# Patient Record
Sex: Male | Born: 1950
Health system: Southern US, Community
[De-identification: ages and names within clinical notes are randomized; demographics above are authoritative.]

## PROBLEM LIST (undated history)

## (undated) DIAGNOSIS — E7800 Pure hypercholesterolemia, unspecified: Secondary | ICD-10-CM

## (undated) DIAGNOSIS — N289 Disorder of kidney and ureter, unspecified: Secondary | ICD-10-CM

## (undated) DIAGNOSIS — K449 Diaphragmatic hernia without obstruction or gangrene: Secondary | ICD-10-CM

## (undated) DIAGNOSIS — I10 Essential (primary) hypertension: Secondary | ICD-10-CM

## (undated) DIAGNOSIS — E669 Obesity, unspecified: Secondary | ICD-10-CM

## (undated) DIAGNOSIS — E119 Type 2 diabetes mellitus without complications: Secondary | ICD-10-CM

## (undated) HISTORY — DX: Obesity, unspecified: E66.9

## (undated) HISTORY — DX: Pure hypercholesterolemia, unspecified: E78.00

## (undated) HISTORY — DX: Essential (primary) hypertension: I10

## (undated) HISTORY — DX: Diaphragmatic hernia without obstruction or gangrene: K44.9

## (undated) HISTORY — DX: Disorder of kidney and ureter, unspecified: N28.9

## (undated) HISTORY — DX: Type 2 diabetes mellitus without complications: E11.9

---
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
## 1997-10-10 ENCOUNTER — Other Ambulatory Visit: Admission: RE | Admit: 1997-10-10 | Discharge: 1997-10-10 | Payer: Self-pay | Admitting: Cardiology

## 1998-10-18 ENCOUNTER — Emergency Department (HOSPITAL_COMMUNITY): Admission: EM | Admit: 1998-10-18 | Discharge: 1998-10-18 | Payer: Self-pay | Admitting: Emergency Medicine

## 1999-10-01 ENCOUNTER — Encounter (INDEPENDENT_AMBULATORY_CARE_PROVIDER_SITE_OTHER): Payer: Self-pay | Admitting: Specialist

## 1999-10-01 ENCOUNTER — Ambulatory Visit (HOSPITAL_COMMUNITY): Admission: RE | Admit: 1999-10-01 | Discharge: 1999-10-01 | Payer: Self-pay | Admitting: Gastroenterology

## 2000-09-22 ENCOUNTER — Ambulatory Visit (HOSPITAL_COMMUNITY): Admission: RE | Admit: 2000-09-22 | Discharge: 2000-09-22 | Payer: Self-pay | Admitting: Gastroenterology

## 2000-09-22 ENCOUNTER — Encounter (INDEPENDENT_AMBULATORY_CARE_PROVIDER_SITE_OTHER): Payer: Self-pay | Admitting: Specialist

## 2001-08-30 ENCOUNTER — Encounter: Payer: Self-pay | Admitting: Emergency Medicine

## 2001-08-30 ENCOUNTER — Emergency Department (HOSPITAL_COMMUNITY): Admission: EM | Admit: 2001-08-30 | Discharge: 2001-08-30 | Payer: Self-pay | Admitting: Emergency Medicine

## 2001-09-10 ENCOUNTER — Encounter: Payer: Self-pay | Admitting: General Surgery

## 2001-09-11 ENCOUNTER — Encounter: Payer: Self-pay | Admitting: General Surgery

## 2001-09-11 ENCOUNTER — Observation Stay (HOSPITAL_COMMUNITY): Admission: RE | Admit: 2001-09-11 | Discharge: 2001-09-12 | Payer: Self-pay | Admitting: General Surgery

## 2001-09-11 ENCOUNTER — Encounter (INDEPENDENT_AMBULATORY_CARE_PROVIDER_SITE_OTHER): Payer: Self-pay

## 2002-12-20 ENCOUNTER — Inpatient Hospital Stay (HOSPITAL_COMMUNITY): Admission: EM | Admit: 2002-12-20 | Discharge: 2002-12-22 | Payer: Self-pay | Admitting: Urology

## 2005-10-18 HISTORY — PX: CARDIOVASCULAR STRESS TEST: SHX262

## 2010-04-05 ENCOUNTER — Encounter: Payer: Self-pay | Admitting: Cardiovascular Disease

## 2010-04-05 ENCOUNTER — Ambulatory Visit: Payer: Self-pay | Admitting: Cardiology

## 2010-06-12 ENCOUNTER — Encounter: Payer: Self-pay | Admitting: Family Medicine

## 2010-07-16 ENCOUNTER — Other Ambulatory Visit: Payer: Self-pay | Admitting: Dermatology

## 2010-07-30 ENCOUNTER — Ambulatory Visit (INDEPENDENT_AMBULATORY_CARE_PROVIDER_SITE_OTHER): Payer: PRIVATE HEALTH INSURANCE | Admitting: Cardiology

## 2010-07-30 ENCOUNTER — Other Ambulatory Visit: Payer: Self-pay | Admitting: Cardiology

## 2010-07-30 DIAGNOSIS — E7800 Pure hypercholesterolemia, unspecified: Secondary | ICD-10-CM

## 2010-07-30 DIAGNOSIS — E119 Type 2 diabetes mellitus without complications: Secondary | ICD-10-CM

## 2010-07-30 LAB — COMPREHENSIVE METABOLIC PANEL
ALT: 21 U/L (ref 0–53)
AST: 21 U/L (ref 0–37)
Albumin: 5.1 g/dL (ref 3.5–5.2)
Alkaline Phosphatase: 61 U/L (ref 39–117)
BUN: 21 mg/dL (ref 6–23)
CO2: 25 mEq/L (ref 19–32)
Calcium: 9.9 mg/dL (ref 8.4–10.5)
Chloride: 105 mEq/L (ref 96–112)
Creat: 1.11 mg/dL (ref 0.40–1.50)
Glucose, Bld: 101 mg/dL — ABNORMAL HIGH (ref 70–99)
Potassium: 4.5 mEq/L (ref 3.5–5.3)
Sodium: 141 mEq/L (ref 135–145)
Total Bilirubin: 0.8 mg/dL (ref 0.3–1.2)
Total Protein: 7.3 g/dL (ref 6.0–8.3)

## 2010-07-30 LAB — LIPID PANEL
Cholesterol: 128 mg/dL (ref 0–200)
HDL: 42 mg/dL (ref >39)
LDL Cholesterol: 66 mg/dL (ref 0–99)
Total CHOL/HDL Ratio: 3 Ratio
Triglycerides: 102 mg/dL (ref <150)
VLDL: 20 mg/dL (ref 0–40)

## 2010-08-06 ENCOUNTER — Ambulatory Visit: Payer: Self-pay | Admitting: Cardiology

## 2010-10-08 NOTE — Op Note (Signed)
[facility]  Patient:    JOANDRY, SLAGTER Visit Number: [id number] MRN: [id number]          Service Type: DSU Location: DAY Attending Physician:  Caleen Essex Dictated by:   Ollen Gross. Vernell Morgans, M.D. Proc. Date: 09/11/01 Admit Date:  09/11/2001                             Operative Report  PREOPERATIVE DIAGNOSIS:  Cholelithiasis.  POSTOPERATIVE DIAGNOSIS:  Cholelithiasis.  PROCEDURE:  Laparoscopic cholecystectomy with negative intraoperative cholangiogram.  SURGEON:  Ollen Gross. Vernell Morgans, M.D.  ASSISTANT:  Anselm Pancoast. Zachery Dakins, M.D.  ANESTHESIA:  General endotracheal.  DESCRIPTION OF PROCEDURE:  After informed consent was obtained, the patient was brought to the operating room and placed in a supine position on the operating table.  After adequate induction of general anesthesia, the patients abdomen was prepped with Betadine and draped in the usual sterile manner.  The area above the umbilicus was infiltrated with 0.25% Marcaine and a small transverse incision was made with the 15 blade knife.  This incision was carried down through to the subcutaneous tissue using blunt dissection with a Kelly clamp and [employer] retractors until the linea alba was identified.  The linea labia was incised with a 15 blade knife and each side was grasped with Kocher clamps and elevated anteriorly.  The preperitoneal space was then probed bluntly with the hemostat until the peritoneum was opened, and access was gained to the abdominal cavity.  A finger was inserted through this opening to palpate the anterior abdominal wall and there were no apparent adhesions.  A 0 Vicryl pursestring stitch was then placed in the fascia surrounding this opening.  The Hasson cannula was placed through this opening and anchored in place with the previously placed Vicryl pursestring stitch.  The abdomen was then insufflated with carbon dioxide without difficulty.  A laparoscope was placed  through the Hasson cannula and the abdomen was generally inspected and there were no obvious abnormalities.  In the right upper quadrant, the dome of the gallbladder and edge of the liver were readily identifiable.  The patient was placed in the head up position.  A small upper midline transverse incision was made after infiltrating this area with 0.25% Marcaine.  A 10 mm port was then placed bluntly through this incision and into the abdominal cavity under direct vision.  Places were chosen laterally on the right side of the abdomen for placement of the 5 mm ports and each of these areas was infiltrated with 0.25% Marcaine.  Small stab incisions were made and 5 mm ports were then placed bluntly through these incisions into the abdominal cavity under direct vision.  A blunt grasper was placed through the lateral most 5 mm port and used to grasp the dome of the gallbladder and elevated anteriorly and superiorly.  There was a small adhesion just lateral to the gallbladder of omentum to the liver and this made a small tear on the capsule of the liver.  This adhesion was then taken down with a hook electrocautery.  Another blunt grasper was placed through the other 5 mm port and used to retract on the body and neck of the gallbladder. A dissector was placed through the upper midline port and using the electrocautery, the peritoneum reflection in the area of the gallbladder neck was opened.  The blunt dissection was then carried out in this area until the  gallbladder neck and cystic duct junction was readily identified.  This area was dissected circumferentially until a good window was created.  A single clip was placed on the gallbladder neck side of this dissection and a small ductotomy was made.  There seemed to be sludge within the cystic duct and a 14-gauge angiocath was then placed percutaneously through the anterior abdominal wall and a Reddick catheter was placed through the angiocath.   The Reddick catheter was placed within the cystic duct and anchored in place with a clip and the catheter flushed easily.  A cholangiogram was then obtained which showed a long cystic duct and no filling defects within the common duct system and rapid emptying into the duodenum.  The anchoring clip was then removed and the Reddick catheter was removed from the patient with the angiocath.  Three clips were then placed proximally on the cystic duct and this duct was divided between the two sets of clips.  Blunt dissection was then carried out posterior to this where the cystic artery was identified. This was dissected in a circumferential manner until a good window was created and two clips were placed proximally and one distally on the artery, and the artery was divided between the two.  Next, a laparoscopic hook cautery device was used to separate the gallbladder from the liver bed.  Prior to completely detaching the gallbladder from the liver bed, the liver bed was inspected, and small bleeding points were coagulated with the spatula electrocautery.  The gallbladder was then detached the rest of the way from the liver bed with the spatula.  The laparoscope was then moved to the upper midline port and a gallbladder grasper was placed through the Hasson cannula and grasped the neck of the gallbladder.  The gallbladder was then removed with the Hasson cannula through the supraumbilical port without difficulty.  The fascial defect at the supraumbilical port was then closed with the previously placed Vicryl pursestring stitch.  The abdomen was then inspected and the liver bed appeared to be hemostatic.  The abdomen was irrigated with copious amounts of saline until the effluent was clear.  The rest of the ports were then removed all under direct vision and were found to be hemostatic.  The gas was allowed to escape and the skin incisions were closed with interrupted 4-0 Monocryl subcuticular  stitches.  Benzoin and Steri-Strips were applied.  The patient tolerated the procedure well.  At the end of the case, all needle,  sponge, and instrument counts were correct.  The patient was awakened and taken to the recovery room in stable condition. Dictated by:   Ollen Gross. Vernell Morgans, M.D. Attending Physician:  Caleen Essex DD:  09/11/01 TD:  09/11/01 Job: [id number]

## 2010-10-08 NOTE — H&P (Signed)
Joseph Nguyen, Joseph Nguyen                           ACCOUNT NO.:  [id number]   MEDICAL RECORD NO.:  [id number]                   PATIENT TYPE:  INP   LOCATION:  0357                                 FACILITY:  [facility]   PHYSICIAN:  Claudette Laws, M.D.               DATE OF BIRTH:  December 14, 1950   DATE OF ADMISSION:  12/20/2002  DATE OF DISCHARGE:                                HISTORY & PHYSICAL   CHIEF COMPLAINT:  Flank pain.   PRESENT ILLNESS:  This 60 year old man with known adult polycystic kidney  disease comes in now with about a 3-4 day history of rather severe flank  pain and some nausea.  Initially he presented at his work place in [place]  with severe flank pain.  He was worked up there.  They called me about him,  and I saw him in the office on December 17, 2002.  The next day he had a CT scan  in our office, which presently is at [facility] for an over-read, but it appears it  may have led into a cyst.  This patient's history goes back to at least 1986  when we first saw him and diagnosed him with the disease, as well as  hematuria.  Since then, he was seen by Dr. Darrick Penna from nephrology, but no  recommendations were made.  He had been in our office on and off over the  years, and was last seen by Dr. Wanda Plump on November 11, 1998 with some blood  in the urine, but since then over the past four years has been essentially  asymptomatic.  However, this past week he has had intermittent pain.  He  tried to go to work today, but got real diaphoretic and was transferred back  to [place].  I saw him in office, and his pulse was only 45, although his  blood pressure was 156/85.  His temperature was 97.7.  I thought at this  point the best thing to do would be admit him, check his blood count,  possibly repeat the CT scan.  I did discuss the situation with the radiology  interventionalist, but really it was not felt that there was any definitive  interventions that could be possible, although we  discussed such things as  cyst aspiration and sclerosis, but basically the treatment over the years  has been to treat him symptomatically and treat any intervening infections.  He has been on Cipro, although our urine culture showed no growth.   PAST MEDICAL HISTORY:  The patient does have a history of hypertension.   MEDICATIONS:  1. Hyzaar.  2. Lipitor.  3. Aspirin.   ALLERGIES:  No known drug allergies, although MORPHINE made him sick in  [place], [place].   PAST SURGICAL HISTORY:  He had his gallbladder removed in the year 2000.  Otherwise no surgery.   REVIEW OF SYSTEMS:  Essentially negative  review of systems.   The patient presented into the office with his wife and daughter.   FAMILY HISTORY:  Father died at age 30 from a heart attack and a stroke.  Mother is alive at 23.  He has four brothers and one sister.  He himself has  three children with his wife.   SOCIAL HISTORY:  He works as a IT trainer.  He is a social drinker.   When we first saw him, his urine was fairly clear with only a rare RBC.  His  wife states that he has not urinated today.   PHYSICAL EXAMINATION:  GENERAL:  He appears in rather moderate distress in a  wheelchair, again complaining of right flank pain.  VITAL SIGNS:  In our office, his blood pressure was 156/85, pulse 45,  temperature 97.7.  HEENT:  Unremarkable.  CHEST:  Clear to auscultation and percussion.  HEART:  Slow rhythm.  No obvious murmurs.  ABDOMEN:  Somewhat obese.  Again, CVA tenderness as in the past.  No obvious  hernias.  GENITALIA:  Circumcised male.  No lesions.  Normal testicles.  RECTUM:  Flat, smooth, nontender.  No masses, no nodules.  EXTREMITIES:  Normal.  Somewhat diaphoretic.   IMPRESSION:  1. Adult polycystic kidney disease with possible hemorrhage into a renal     cyst.  2. Rule out hydronephrosis secondary to ureteral clot colic.  3. History of hypertension.   PLAN:  I discussed the situation with him and  his wife, and I thought I  would bring him into the hospital and stabilize him with a PCA pump,  probably Dilaudid, since morphine made him sick when he was out of town.  We  will obtain a stat CBC and a BMET, and start an IV.  Will check his residual  urine, measure his output, and then possibly obtain another CT scan  tomorrow.  I will continue his IV Cipro for now and monitor his bradycardia.                                               Claudette Laws, M.D.    RFS/MEDQ  D:  12/20/2002  T:  12/20/2002  Job:  [id number]

## 2010-10-08 NOTE — Procedures (Signed)
[place]  Patient:    LADARIAN, BONCZEK                        MRN: [id number] Proc. Date: 10/01/99 Adm. Date:  [id number] Disc. Date: [id number] Attending:  Louie Bun CC:         Chales Salmon. Abigail Miyamoto, M.D.                           Procedure Report  PROCEDURE PERFORMED:  Colonoscopy.  ENDOSCOPIST:  Everardo All. Madilyn Fireman, M.D.  INDICATIONS FOR PROCEDURE:  Iron deficiency anemia in a 60 year old patient with no prior colon screening.  DESCRIPTION OF PROCEDURE:  The patient was placed in the left lateral decubitus position and placed on the pulse monitor and continuous low flow oxygen delivered by nasal cannula.  She was sedated with 100 mg IV Demerol and 10 mg IV Versed.  The Olympus video colonoscope was inserted into the rectum and advanced to the cecum, confirmed by transillumination of McBurneys point and visualization of the ileocecal valve and the appendiceal orifice.  The prep was excellent.  The cecum, ascending, transverse, descending and sigmoid colon all appeared normal with no masses, polyps, diverticula or other mucosal abnormalities.  The rectum likewise appeared normal and a retroflex view of the anus revealed some small internal hemorrhoids.  The colonoscope was then withdrawn and the patient returned to the recovery room in stable condition having tolerated the procedure well.   There were no immediate complications.  IMPRESSION:  Internal hemorrhoids.  Otherwise normal colonoscopy.  PLAN:  Further work-up of anemia as appropriate. DD:  10/01/99 TD:  10/04/99 Job: [id number]

## 2010-10-08 NOTE — Procedures (Signed)
[facility]  Patient:    Joseph Nguyen                          MRN: [id number] Proc. Date: 09/22/00 Attending:  Everardo All. Madilyn Fireman, M.D.                           Procedure Report  PROCEDURE:  Colonoscopy with polypectomy.  INDICATION FOR PROCEDURE:  Large sessile ascending colon polyps on previous procedure.  The most proximal one had a lipomatous appearance, but biopsies showed adenoma.  The second one was snared, hopefully to destruction, and a third ascending colon polyp was cold biopsied but not removed at the time.  DESCRIPTION OF PROCEDURE:  The patient was placed in the left lateral decubitus position and placed on the pulse monitor with continuous low-flow oxygen delivered by nasal cannula.  He was sedated with 70 mg IV Demerol and 7 mg IV Versed.  The Olympus video colonoscope was inserted into the rectum and advanced to the cecum, confirmed by transillumination at McBurneys point and visualization of the ileocecal valve and appendiceal orifice.  The prep was good.  The cecum appeared normal.  Just beyond the ileocecal valve was seen the previously seen yellowish, lipomatous-looking lesion that had shown adenomatous elements when biopsied.  This was snared and seemed to be removed essentially in one piece, but I touched up the base with the hot biopsy forceps, and this was sent in the first specimen container.  A second larger sessile polyp approximately 1.5-2 cm in diameter, corresponding to the lesion that had been biopsied but not fulgurated was seen in the mid ascending colon. This was removed by snare in two pieces.  It was not clear whether the entire lesion had been completely destroyed.  The remainder of the descending, transverse, and descending colon appeared normal with no further polyps. Within the sigmoid colon there was seen a few diverticula, and within the sigmoid and rectum, there were seen several small scattered,  somewhat translucent sessile polyps less than 8 mm in diameter.  These appeared to be consistent with hyperplastic polyps, and I did not address any of these. Otherwise the rectum appeared normal.  The colonoscope was then withdrawn, and the patient returned to the recovery room in stable condition.  He tolerated the procedure well, and there were no immediate complications.  IMPRESSION: 1. Ascending colon polyp with possible lipomatous elements. 2. Second larger ascending colon polyps removed by snare. 3. Diverticulosis. 4. Diminutive rectal and rectosigmoid polyps, probably hyperplastic.  PLAN:  Await biopsy results and based on uncertainty of removal of the larger of the two polyps, we will probably repeat colonoscopy in one year. DD:  09/22/00 TD:  09/22/00 Job: [id number]

## 2010-10-08 NOTE — Procedures (Signed)
[place]. [facility]  Patient:    Joseph Nguyen, Joseph Nguyen                        MRN: [id number] Proc. Date: 10/01/99 Adm. Date:  [id number] Disc. Date: [id number] Attending:  Louie Bun Dictator:   Everardo All Madilyn Fireman, M.D.                           Procedure Report  PROCEDURE:  Colonoscopy with polypectomy and biopsy.  INDICATIONS:  Intermittent rectal bleeding of six months duration in a 60 year old patient.  DESCRIPTION OF PROCEDURE:  The patient was placed in the left lateral decubitus position and placed on the pulse monitor with continuous low flow oxygen delivered by nasal cannula.  He was sedated with 50 mg of IV Demerol and 5 mg of IV Versed.  The Olympus video colonoscope was inserted into the rectum and advanced to the cecum, confirmed by transillumination of McBurneys point and visualization of the ileocecal valve and appendiceal orifice.  The prep was fairly good.  The base of the cecum appeared normal.  At the distal end of the cecum there was a vague oblong shaped elevation of mucosa along a ______ ridge with slight granularity and some yellow color suggesting a fatty component.  Overall, it could not be clearly ascertained whether this was an adenomatous polyp, a lipoma, or other histology.  I attempted to hot biopsy in places, but this was incomplete and some viable tissue was left behind.  In the ascending colon just distal to this there was a fold of elevated mucosa that did not have a fatty appearance to it, and the surface appeared somewhat velvety, consistent with an adenoma, but it was very poorly delineated from the surrounding mucosa.  I carefully tried to then snared what appeared to be the margins of this lesion and did remove this section of mucosa in one piece, and this was sent in a separate specimen container.  A third lesion very similar to the second one was seen 5 cm distal, and I elected not to snare this lesion, given its vague  margins and uncertain about whether it represented true neoplasm.  I elected with this lesion to take cold biopsies from the most granular areas of the surface, and these were sent in a third specimen container.  The remainder of the ascending, transverse, descending, and sigmoid colon appeared normal with no further polyps, masses, diverticula, or other mucosal abnormalities.  The rectum appeared normal, but retroflex view of the anus did reveal some small internal hemorrhoids.  The colonoscope was then withdrawn, and the patient returned to the recovery room in stable condition.  He tolerated the procedure well, and there were no immediate complications.  IMPRESSION: 1. One cecal oblong lesion, unclear of whether representing polyp or lipoma. 2. Two ascending colon lesions suspicious for adenoma, but very poorly delineated from the surrounding mucosa.  One lesion was snared, the other was biopsied. 3. Internal hemorrhoids.  PLAN:  Await histology for determination of need and interval for repeat colonoscope for further fulguration. DD:  10/01/99 TD:  10/03/99 Job: [id number]

## 2011-03-11 ENCOUNTER — Other Ambulatory Visit: Payer: Self-pay | Admitting: Gastroenterology

## 2011-03-23 ENCOUNTER — Other Ambulatory Visit: Payer: Self-pay | Admitting: Dermatology

## 2011-06-16 ENCOUNTER — Other Ambulatory Visit: Payer: Self-pay | Admitting: Cardiology

## 2011-06-17 ENCOUNTER — Other Ambulatory Visit: Payer: Self-pay | Admitting: *Deleted

## 2011-06-17 ENCOUNTER — Other Ambulatory Visit: Payer: Self-pay | Admitting: Cardiovascular Disease

## 2011-06-17 NOTE — Telephone Encounter (Signed)
Opened in Error.

## 2011-06-17 NOTE — Telephone Encounter (Signed)
Refill F/U    Patient wife Lynden Ang calling for f/u on patient crestor Refill.  She can be reached at [phone]

## 2011-06-17 NOTE — Telephone Encounter (Signed)
Fax Received. Refill Completed. Joseph Nguyen (R.M.A)   

## 2011-07-21 ENCOUNTER — Encounter: Payer: Self-pay | Admitting: *Deleted

## 2011-07-27 ENCOUNTER — Ambulatory Visit (INDEPENDENT_AMBULATORY_CARE_PROVIDER_SITE_OTHER): Payer: PRIVATE HEALTH INSURANCE | Admitting: Cardiovascular Disease

## 2011-07-27 ENCOUNTER — Encounter: Payer: Self-pay | Admitting: Cardiovascular Disease

## 2011-07-27 ENCOUNTER — Encounter: Payer: Self-pay | Admitting: *Deleted

## 2011-07-27 DIAGNOSIS — I10 Essential (primary) hypertension: Secondary | ICD-10-CM

## 2011-07-27 DIAGNOSIS — E785 Hyperlipidemia, unspecified: Secondary | ICD-10-CM

## 2011-07-27 HISTORY — DX: Essential (primary) hypertension: I10

## 2011-07-27 NOTE — Progress Notes (Signed)
    Trenda Moots Date of Birth  1950/09/15 [facility]     [place]  [address]    [address]   [address] [place], [place]  [address]    [place], [place]  [address] [phone]  Fax  [phone]  [phone]  Fax [phone]  Problems: 1. HTN  History of Present Illness:  Joseph Nguyen is a 61 year old gentleman who was previously seen by Dr. Deborah Chalk. He's done well. He has a history of hypertension. He denies any episodes of chest pain or shortness of breath.  Current Outpatient Prescriptions on File Prior to Visit  Medication Sig Dispense Refill  . CRESTOR 10 MG tablet take 1 tablet by mouth once daily  60 tablet  0  . losartan-hydrochlorothiazide (HYZAAR) 100-12.5 MG per tablet Take 1 tablet by mouth daily.        No Known Allergies  Past Medical History  Diagnosis Date  . Hypercholesterolemia   . Hiatal hernia   . Obesity   . HTN (hypertension)   . Kidney disease     Polycystic,   . DM (diabetes mellitus)     Past Surgical History  Procedure Date  . Tumor removal 1968    Left Breast Benign  . Cardiovascular stress test 10-18-2005    EF 79%    History  Smoking status  . Former Smoker  Smokeless tobacco  . Not on file  Comment: Quite 11yrs+ ago    History  Alcohol Use: Not on file    No family history on file.  Reviw of Systems:  Reviewed in the HPI.  All other systems are negative.  Physical Exam: Blood pressure 127/86, pulse 58, height 6' (1.829 m), weight 213 lb 12.8 oz (96.979 kg). General: Well developed, well nourished, in no acute distress.  Head: Normocephalic, atraumatic, sclera non-icteric, mucus membranes are moist,   Neck: Supple. Carotids are 2 + without bruits. No JVD  Lungs: Clear bilaterally to auscultation.  Heart: regular rate  With normal  S1 S2. No murmurs, gallops or rubs.  Abdomen: Soft, non-tender, non-distended with normal bowel sounds. No hepatomegaly. No rebound/guarding. No masses.  Msk:   Strength and tone are normal  Extremities: No clubbing or cyanosis. No edema.  Distal pedal pulses are 2+ and equal bilaterally.  Neuro: Alert and oriented X 3. Moves all extremities spontaneously.  Psych:  Responds to questions appropriately with a normal affect.  ECG: Sinus bradycardia.  No ST or T wave changes.  Assessment / Plan:

## 2011-07-27 NOTE — Patient Instructions (Signed)
Your physician wants you to follow-up in: 1 year  You will receive a reminder letter in the mail two months in advance. If you don't receive a letter, please call our office to schedule the follow-up appointment.  Your physician recommends that you return for a FASTING lipid profile: one year or bring a copy of your last cholesterol levels from your primary

## 2011-07-27 NOTE — Assessment & Plan Note (Signed)
Joseph Nguyen is doing very well. We'll continue with the same medications.  I'll see him again in one year for followup office visit.

## 2011-09-02 ENCOUNTER — Other Ambulatory Visit: Payer: Self-pay | Admitting: Cardiovascular Disease

## 2012-10-02 ENCOUNTER — Other Ambulatory Visit: Payer: Self-pay

## 2012-10-02 MED ORDER — ROSUVASTATIN CALCIUM 10 MG PO TABS: ORAL_TABLET | ORAL | Status: DC

## 2012-10-02 NOTE — Telephone Encounter (Signed)
pt wife called to rqst refiil for pt crestor. pt wife aware that pt is past due for follow up visit w/Dr.[name] pt will call back to schedule. refill completed for 30r-1 pt wife aware

## 2012-11-15 ENCOUNTER — Encounter: Payer: Self-pay | Admitting: Cardiology

## 2012-11-21 ENCOUNTER — Ambulatory Visit: Payer: PRIVATE HEALTH INSURANCE | Admitting: Cardiovascular Disease

## 2012-12-06 ENCOUNTER — Other Ambulatory Visit: Payer: Self-pay

## 2012-12-06 MED ORDER — ROSUVASTATIN CALCIUM 10 MG PO TABS: ORAL_TABLET | ORAL | Status: DC

## 2012-12-17 ENCOUNTER — Ambulatory Visit: Payer: PRIVATE HEALTH INSURANCE | Admitting: Cardiovascular Disease

## 2013-03-05 ENCOUNTER — Ambulatory Visit (INDEPENDENT_AMBULATORY_CARE_PROVIDER_SITE_OTHER): Payer: 59 | Admitting: Emergency Medicine

## 2013-03-05 VITALS — BP 122/76 | HR 62 | Temp 97.9°F | Resp 18 | Ht 70.75 in | Wt 216.0 lb

## 2013-03-05 DIAGNOSIS — Z7189 Other specified counseling: Secondary | ICD-10-CM

## 2013-03-05 DIAGNOSIS — Z23 Encounter for immunization: Secondary | ICD-10-CM

## 2013-03-05 MED ORDER — TYPHOID VACCINE PO CPDR: 1.0000 | DELAYED_RELEASE_CAPSULE | ORAL | Status: DC

## 2013-03-05 NOTE — Progress Notes (Signed)
  Subjective:    Patient ID: Joseph Nguyen, male    DOB: [birth year], 62 y.o.   MRN: [id number]  HPI Patient presents for immunizations for upcoming travel to [place], [place] and [place].  No current/recent illness.  Otherwise he has no complaints.  States polio was recommended as well, however he has already had that when in the [employer].  Past Medical History  Diagnosis Date  . Hypercholesterolemia   . Hiatal hernia   . Obesity   . HTN (hypertension)   . Kidney disease     Polycystic,   . DM (diabetes mellitus)    Past Surgical History  Procedure Laterality Date  . Tumor removal  1968    Left Breast Benign  . Cardiovascular stress test  10-18-2005    EF 79%   Allergies  Allergen Reactions  . Zocor [Simvastatin - High Dose]     myalgias      Review of Systems All systems negative.    Objective:   Physical Exam  Blood pressure 122/76, pulse 62, temperature 97.9 F (36.6 C), temperature source Oral, resp. rate 18, height 5' 10.75" (1.797 m), weight 216 lb (97.977 kg), SpO2 97.00%. Body mass index is 30.34 kg/(m^2). Well-developed, well nourishe male who is awake, alert and oriented, in NAD. HEENT: [place]/AT, PERRL, EOMI.  Sclera and conjunctiva are clear. OP is clear. Neck: supple Heart: RRR, no murmur Lungs: normal effort Abdomen: normo-active bowel sounds, supple, non-tender, no mass or organomegaly. Extremities: no cyanosis, clubbing or edema. Skin: warm and dry without rash. Psychologic: good mood and appropriate affect, normal speech and behavior.       Assessment & Plan:  Immunizations  Given Hep A and rx for typhoid

## 2013-03-08 ENCOUNTER — Encounter: Payer: PRIVATE HEALTH INSURANCE | Admitting: Internal Medicine

## 2013-08-22 ENCOUNTER — Other Ambulatory Visit: Payer: Self-pay

## 2013-08-22 MED ORDER — ROSUVASTATIN CALCIUM 10 MG PO TABS: ORAL_TABLET | ORAL | Status: DC

## 2013-11-12 ENCOUNTER — Other Ambulatory Visit: Payer: Self-pay | Admitting: Internal Medicine

## 2013-11-12 DIAGNOSIS — Z87891 Personal history of nicotine dependence: Secondary | ICD-10-CM

## 2014-01-06 ENCOUNTER — Ambulatory Visit
Admission: RE | Admit: 2014-01-06 | Discharge: 2014-01-06 | Disposition: A | Discharge status: Home / Self Care | Payer: 59 | Source: Ambulatory Visit | Attending: Internal Medicine | Admitting: Internal Medicine

## 2014-01-06 DIAGNOSIS — Z136 Encounter for screening for cardiovascular disorders: Secondary | ICD-10-CM | POA: Diagnosis not present

## 2014-01-06 DIAGNOSIS — Z87891 Personal history of nicotine dependence: Secondary | ICD-10-CM

## 2014-01-22 DIAGNOSIS — R040 Epistaxis: Secondary | ICD-10-CM | POA: Diagnosis not present

## 2014-01-22 DIAGNOSIS — J310 Chronic rhinitis: Secondary | ICD-10-CM | POA: Diagnosis not present

## 2014-03-21 DIAGNOSIS — Z23 Encounter for immunization: Secondary | ICD-10-CM | POA: Diagnosis not present

## 2014-03-27 ENCOUNTER — Other Ambulatory Visit: Payer: Self-pay | Admitting: Dermatology

## 2014-03-27 DIAGNOSIS — Z85828 Personal history of other malignant neoplasm of skin: Secondary | ICD-10-CM | POA: Diagnosis not present

## 2014-03-27 DIAGNOSIS — L400 Psoriasis vulgaris: Secondary | ICD-10-CM | POA: Diagnosis not present

## 2014-03-27 DIAGNOSIS — L570 Actinic keratosis: Secondary | ICD-10-CM | POA: Diagnosis not present

## 2014-03-27 DIAGNOSIS — D1801 Hemangioma of skin and subcutaneous tissue: Secondary | ICD-10-CM | POA: Diagnosis not present

## 2014-03-27 DIAGNOSIS — D485 Neoplasm of uncertain behavior of skin: Secondary | ICD-10-CM | POA: Diagnosis not present

## 2014-05-21 ENCOUNTER — Other Ambulatory Visit: Payer: Self-pay | Admitting: *Deleted

## 2014-05-21 MED ORDER — ROSUVASTATIN CALCIUM 10 MG PO TABS: ORAL_TABLET | ORAL | Status: AC

## 2014-07-02 DIAGNOSIS — Z85828 Personal history of other malignant neoplasm of skin: Secondary | ICD-10-CM | POA: Diagnosis not present

## 2014-07-02 DIAGNOSIS — L570 Actinic keratosis: Secondary | ICD-10-CM | POA: Diagnosis not present

## 2014-08-05 DIAGNOSIS — Q612 Polycystic kidney, adult type: Secondary | ICD-10-CM | POA: Diagnosis not present

## 2014-08-05 DIAGNOSIS — I10 Essential (primary) hypertension: Secondary | ICD-10-CM | POA: Diagnosis not present

## 2014-09-17 DIAGNOSIS — E785 Hyperlipidemia, unspecified: Secondary | ICD-10-CM | POA: Diagnosis not present

## 2014-09-17 DIAGNOSIS — Z6830 Body mass index (BMI) 30.0-30.9, adult: Secondary | ICD-10-CM | POA: Diagnosis not present

## 2014-09-17 DIAGNOSIS — R1084 Generalized abdominal pain: Secondary | ICD-10-CM | POA: Diagnosis not present

## 2014-09-19 ENCOUNTER — Other Ambulatory Visit: Payer: Self-pay | Admitting: Internal Medicine

## 2014-09-19 DIAGNOSIS — R1084 Generalized abdominal pain: Secondary | ICD-10-CM

## 2014-09-25 ENCOUNTER — Ambulatory Visit
Admission: RE | Admit: 2014-09-25 | Discharge: 2014-09-25 | Disposition: A | Discharge status: Home / Self Care | Payer: Medicare Other | Source: Ambulatory Visit | Attending: Internal Medicine | Admitting: Internal Medicine

## 2014-09-25 DIAGNOSIS — R1084 Generalized abdominal pain: Secondary | ICD-10-CM | POA: Diagnosis not present

## 2014-09-25 DIAGNOSIS — K5790 Diverticulosis of intestine, part unspecified, without perforation or abscess without bleeding: Secondary | ICD-10-CM | POA: Diagnosis not present

## 2014-09-25 DIAGNOSIS — N281 Cyst of kidney, acquired: Secondary | ICD-10-CM | POA: Diagnosis not present

## 2014-09-25 MED ORDER — IOPAMIDOL (ISOVUE-300) INJECTION 61%
100.0000 mL | Freq: Once | INTRAVENOUS | Status: AC | PRN
  Administered 2014-09-25: 100 mL via INTRAVENOUS

## 2014-10-21 DIAGNOSIS — B372 Candidiasis of skin and nail: Secondary | ICD-10-CM | POA: Diagnosis not present

## 2014-10-21 DIAGNOSIS — Q613 Polycystic kidney, unspecified: Secondary | ICD-10-CM | POA: Diagnosis not present

## 2014-10-21 DIAGNOSIS — I10 Essential (primary) hypertension: Secondary | ICD-10-CM | POA: Diagnosis not present

## 2014-10-21 DIAGNOSIS — Z6829 Body mass index (BMI) 29.0-29.9, adult: Secondary | ICD-10-CM | POA: Diagnosis not present

## 2014-10-21 DIAGNOSIS — E785 Hyperlipidemia, unspecified: Secondary | ICD-10-CM | POA: Diagnosis not present

## 2014-10-21 DIAGNOSIS — E669 Obesity, unspecified: Secondary | ICD-10-CM | POA: Diagnosis not present

## 2014-10-21 DIAGNOSIS — E119 Type 2 diabetes mellitus without complications: Secondary | ICD-10-CM | POA: Diagnosis not present

## 2014-10-21 DIAGNOSIS — Z1389 Encounter for screening for other disorder: Secondary | ICD-10-CM | POA: Diagnosis not present

## 2015-03-09 DIAGNOSIS — R1314 Dysphagia, pharyngoesophageal phase: Secondary | ICD-10-CM | POA: Diagnosis not present

## 2015-03-09 DIAGNOSIS — K219 Gastro-esophageal reflux disease without esophagitis: Secondary | ICD-10-CM | POA: Diagnosis not present

## 2015-03-16 DIAGNOSIS — Z125 Encounter for screening for malignant neoplasm of prostate: Secondary | ICD-10-CM | POA: Diagnosis not present

## 2015-03-16 DIAGNOSIS — E119 Type 2 diabetes mellitus without complications: Secondary | ICD-10-CM | POA: Diagnosis not present

## 2015-03-16 DIAGNOSIS — I10 Essential (primary) hypertension: Secondary | ICD-10-CM | POA: Diagnosis not present

## 2015-03-16 DIAGNOSIS — E785 Hyperlipidemia, unspecified: Secondary | ICD-10-CM | POA: Diagnosis not present

## 2015-03-24 DIAGNOSIS — I10 Essential (primary) hypertension: Secondary | ICD-10-CM | POA: Diagnosis not present

## 2015-03-24 DIAGNOSIS — E669 Obesity, unspecified: Secondary | ICD-10-CM | POA: Diagnosis not present

## 2015-03-24 DIAGNOSIS — M25519 Pain in unspecified shoulder: Secondary | ICD-10-CM | POA: Diagnosis not present

## 2015-03-24 DIAGNOSIS — Z0000 Encounter for general adult medical examination without abnormal findings: Secondary | ICD-10-CM | POA: Diagnosis not present

## 2015-03-24 DIAGNOSIS — Z6830 Body mass index (BMI) 30.0-30.9, adult: Secondary | ICD-10-CM | POA: Diagnosis not present

## 2015-03-24 DIAGNOSIS — Z1389 Encounter for screening for other disorder: Secondary | ICD-10-CM | POA: Diagnosis not present

## 2015-03-24 DIAGNOSIS — Z86010 Personal history of colonic polyps: Secondary | ICD-10-CM | POA: Diagnosis not present

## 2015-03-24 DIAGNOSIS — Q613 Polycystic kidney, unspecified: Secondary | ICD-10-CM | POA: Diagnosis not present

## 2015-03-24 DIAGNOSIS — E119 Type 2 diabetes mellitus without complications: Secondary | ICD-10-CM | POA: Diagnosis not present

## 2015-03-24 DIAGNOSIS — Z23 Encounter for immunization: Secondary | ICD-10-CM | POA: Diagnosis not present

## 2015-03-24 DIAGNOSIS — E785 Hyperlipidemia, unspecified: Secondary | ICD-10-CM | POA: Diagnosis not present

## 2015-03-24 DIAGNOSIS — R1310 Dysphagia, unspecified: Secondary | ICD-10-CM | POA: Diagnosis not present

## 2015-03-26 DIAGNOSIS — Z1212 Encounter for screening for malignant neoplasm of rectum: Secondary | ICD-10-CM | POA: Diagnosis not present

## 2015-04-09 DIAGNOSIS — L570 Actinic keratosis: Secondary | ICD-10-CM | POA: Diagnosis not present

## 2015-04-09 DIAGNOSIS — D225 Melanocytic nevi of trunk: Secondary | ICD-10-CM | POA: Diagnosis not present

## 2015-04-09 DIAGNOSIS — D1801 Hemangioma of skin and subcutaneous tissue: Secondary | ICD-10-CM | POA: Diagnosis not present

## 2015-04-09 DIAGNOSIS — Z85828 Personal history of other malignant neoplasm of skin: Secondary | ICD-10-CM | POA: Diagnosis not present

## 2015-04-21 DIAGNOSIS — Q612 Polycystic kidney, adult type: Secondary | ICD-10-CM | POA: Diagnosis not present

## 2015-05-21 DIAGNOSIS — R1314 Dysphagia, pharyngoesophageal phase: Secondary | ICD-10-CM | POA: Diagnosis not present

## 2015-06-17 DIAGNOSIS — E119 Type 2 diabetes mellitus without complications: Secondary | ICD-10-CM | POA: Diagnosis not present

## 2015-06-17 DIAGNOSIS — Q613 Polycystic kidney, unspecified: Secondary | ICD-10-CM | POA: Diagnosis not present

## 2015-06-17 DIAGNOSIS — Z23 Encounter for immunization: Secondary | ICD-10-CM | POA: Diagnosis not present

## 2015-06-17 DIAGNOSIS — I10 Essential (primary) hypertension: Secondary | ICD-10-CM | POA: Diagnosis not present

## 2015-07-21 ENCOUNTER — Encounter: Payer: Medicare Other | Attending: Internal Medicine | Admitting: *Deleted

## 2015-07-21 ENCOUNTER — Encounter: Payer: Self-pay | Admitting: *Deleted

## 2015-07-21 VITALS — Ht 71.0 in | Wt 207.1 lb

## 2015-07-21 DIAGNOSIS — E119 Type 2 diabetes mellitus without complications: Secondary | ICD-10-CM

## 2015-07-21 DIAGNOSIS — I10 Essential (primary) hypertension: Secondary | ICD-10-CM | POA: Insufficient documentation

## 2015-07-21 DIAGNOSIS — E669 Obesity, unspecified: Secondary | ICD-10-CM | POA: Diagnosis not present

## 2015-07-21 DIAGNOSIS — Z6828 Body mass index (BMI) 28.0-28.9, adult: Secondary | ICD-10-CM | POA: Insufficient documentation

## 2015-07-21 NOTE — Patient Instructions (Signed)
Plan:  Aim for 3-4 Carb Choices per meal (45-60 grams) +/- 1 either way  Aim for 0-15 Carbs per snack if hungry  Include protein in moderation with your meals and snacks Consider reading food labels for Total Carbohydrate and Fat Grams of foods Consider  increasing your activity level by walking/gym for 30 minutes daily as tolerated - Goal of 150 minutes per week Consider checking BG at alternate times per day to include fasting and/or 2 hours after first bite of food as directed by MD  continue taking medication as directed by MD  Calorie [employer] app on iphone  Consider f/u for 60 minutes as needed.

## 2015-07-21 NOTE — Progress Notes (Signed)
Diabetes Self-Management Education  Visit Type: First/Initial  Appt. Start Time: 0800 Appt. End Time: 0930  07/21/2015  [name], identified by name and date of birth, is a 65 y.o. male with a diagnosis of Diabetes: Type 2. [name] presents with his wife Joseph Nguyen with an objective of lowering his A1c with the knowledge that his dietary intake is largley contributing to his elevated glucose. Together they have drastically decreased their bread intake, eliminated white potatoes, increased salads and vegetables. They have also increased their activity.  ASSESSMENT  Height 5\' 11"  (1.803 m), weight 207 lb 1.6 oz (93.94 kg). Body mass index is 28.9 kg/(m^2).      Diabetes Self-Management Education - 07/21/15 [id number]    Visit Information   Visit Type First/Initial   Initial Visit   Diabetes Type Type 2   Are you currently following a meal plan? No   Are you taking your medications as prescribed? Yes   Date Diagnosed 2013   Health Coping   How would you rate your overall health? Good   Psychosocial Assessment   Patient Belief/Attitude about Diabetes Motivated to manage diabetes   Self-care barriers None   Self-management support Doctor's office;Family;CDE visits   Other persons present Patient;Spouse/SO  Wife Joseph Nguyen   Patient Concerns Nutrition/Meal planning;Medication;Monitoring;Healthy Lifestyle;Glycemic Control;Weight Control   Special Needs None   Preferred Learning Style No preference indicated   Learning Readiness Change in progress   How often do you need to have someone help you when you read instructions, pamphlets, or other written materials from your doctor or pharmacy? 2 - Rarely   What is the last grade level you completed in school? College   Complications   Last HgB A1C per patient/outside source 7.1 %   How often do you check your blood sugar? 1-2 times/day   Fasting Blood glucose range (mg/dL) 70-129  prior to dietary modification 150-160   Have you had a dilated  eye exam in the past 12 months? No   Have you had a dental exam in the past 12 months? Yes   Are you checking your feet? Yes   How many days per week are you checking your feet? 4   Dietary Intake   Breakfast 3/4C cheerios, fruit, milk skim / eggs, toast   Snack (morning) almonds & cheese   Lunch veggie plate, salad with protein / meat & cheese sandwich & veggies   Snack (afternoon) almonds & cheese / fruit   Dinner protein, veggies, fruit (no carbs)   Beverage(s) coffee, powder creamer / water, diet Soda, Skim milk   Exercise   Exercise Type Light (walking / raking leaves)   How many days per week to you exercise? 3   How many minutes per day do you exercise? 60   Total minutes per week of exercise 180   Patient Education   Previous Diabetes Education No   Disease state  Definition of diabetes, type 1 and 2, and the diagnosis of diabetes;Factors that contribute to the development of diabetes   Nutrition management  Role of diet in the treatment of diabetes and the relationship between the three main macronutrients and blood glucose level;Food label reading, portion sizes and measuring food.;Carbohydrate counting;Effects of alcohol on blood glucose and safety factors with consumption of alcohol.;Meal options for control of blood glucose level and chronic complications.   Physical activity and exercise  Role of exercise on diabetes management, blood pressure control and cardiac health.   Medications Reviewed patients medication  for diabetes, action, purpose, timing of dose and side effects.   Monitoring Purpose and frequency of SMBG.;Identified appropriate SMBG and/or A1C goals.   Acute complications Taught treatment of hypoglycemia - the 15 rule.   Chronic complications Relationship between chronic complications and blood glucose control;Lipid levels, blood glucose control and heart disease;Identified and discussed with patient  current chronic complications;Reviewed with patient heart  disease, higher risk of, and prevention   Psychosocial adjustment Identified and addressed patients feelings and concerns about diabetes   Individualized Goals (developed by patient)   Nutrition General guidelines for healthy choices and portions discussed   Physical Activity Exercise 3-5 times per week;60 minutes per day   Medications take my medication as prescribed   Monitoring  test my blood glucose as discussed   Reducing Risk increase portions of nuts and seeds   Outcomes   Expected Outcomes Demonstrated interest in learning. Expect positive outcomes   Future DMSE PRN   Program Status Completed      Individualized Plan for Diabetes Self-Management Training:   Learning Objective:  Patient will have a greater understanding of diabetes self-management. Patient education plan is to attend individual and/or group sessions per assessed needs and concerns.    Patient Instructions  Plan:  Aim for 3-4 Carb Choices per meal (45-60 grams) +/- 1 either way  Aim for 0-15 Carbs per snack if hungry  Include protein in moderation with your meals and snacks Consider reading food labels for Total Carbohydrate and Fat Grams of foods Consider  increasing your activity level by walking/gym for 30 minutes daily as tolerated - Goal of 150 minutes per week Consider checking BG at alternate times per day to include fasting and/or 2 hours after first bite of food as directed by MD  continue taking medication as directed by MD  Calorie [facility] app on iphone  Consider f/u for 60 minutes as needed.  Expected Outcomes:  Demonstrated interest in learning. Expect positive outcomes  Education material provided: Living Well with Diabetes, A1C conversion sheet, Meal plan card, My Plate, Snack sheet and Support group flyer  If problems or questions, patient to contact team via:  Phone  Future DSME appointment: PRN

## 2015-07-24 DIAGNOSIS — E784 Other hyperlipidemia: Secondary | ICD-10-CM | POA: Diagnosis not present

## 2015-07-24 DIAGNOSIS — Z6829 Body mass index (BMI) 29.0-29.9, adult: Secondary | ICD-10-CM | POA: Diagnosis not present

## 2015-07-24 DIAGNOSIS — I10 Essential (primary) hypertension: Secondary | ICD-10-CM | POA: Diagnosis not present

## 2015-07-24 DIAGNOSIS — E1122 Type 2 diabetes mellitus with diabetic chronic kidney disease: Secondary | ICD-10-CM | POA: Diagnosis not present

## 2015-07-24 DIAGNOSIS — Q613 Polycystic kidney, unspecified: Secondary | ICD-10-CM | POA: Diagnosis not present

## 2015-07-24 DIAGNOSIS — Z1389 Encounter for screening for other disorder: Secondary | ICD-10-CM | POA: Diagnosis not present

## 2015-07-24 DIAGNOSIS — E668 Other obesity: Secondary | ICD-10-CM | POA: Diagnosis not present

## 2015-09-02 DIAGNOSIS — E119 Type 2 diabetes mellitus without complications: Secondary | ICD-10-CM | POA: Diagnosis not present

## 2015-10-28 DIAGNOSIS — R197 Diarrhea, unspecified: Secondary | ICD-10-CM | POA: Diagnosis not present

## 2015-10-28 DIAGNOSIS — Z86010 Personal history of colonic polyps: Secondary | ICD-10-CM | POA: Diagnosis not present

## 2015-11-06 DIAGNOSIS — K222 Esophageal obstruction: Secondary | ICD-10-CM | POA: Diagnosis not present

## 2015-11-06 DIAGNOSIS — K219 Gastro-esophageal reflux disease without esophagitis: Secondary | ICD-10-CM | POA: Diagnosis not present

## 2015-11-06 DIAGNOSIS — E119 Type 2 diabetes mellitus without complications: Secondary | ICD-10-CM | POA: Diagnosis not present

## 2015-11-06 DIAGNOSIS — K2210 Ulcer of esophagus without bleeding: Secondary | ICD-10-CM | POA: Diagnosis not present

## 2015-11-06 DIAGNOSIS — K449 Diaphragmatic hernia without obstruction or gangrene: Secondary | ICD-10-CM | POA: Diagnosis not present

## 2015-11-06 DIAGNOSIS — I10 Essential (primary) hypertension: Secondary | ICD-10-CM | POA: Diagnosis not present

## 2015-11-06 DIAGNOSIS — Z79899 Other long term (current) drug therapy: Secondary | ICD-10-CM | POA: Diagnosis not present

## 2015-11-06 DIAGNOSIS — R1310 Dysphagia, unspecified: Secondary | ICD-10-CM | POA: Diagnosis not present

## 2015-11-06 DIAGNOSIS — Z7982 Long term (current) use of aspirin: Secondary | ICD-10-CM | POA: Diagnosis not present

## 2015-11-06 DIAGNOSIS — K210 Gastro-esophageal reflux disease with esophagitis: Secondary | ICD-10-CM | POA: Diagnosis not present

## 2015-11-06 DIAGNOSIS — Z7984 Long term (current) use of oral hypoglycemic drugs: Secondary | ICD-10-CM | POA: Diagnosis not present

## 2015-12-25 DIAGNOSIS — R1314 Dysphagia, pharyngoesophageal phase: Secondary | ICD-10-CM | POA: Diagnosis not present

## 2015-12-25 DIAGNOSIS — Z86010 Personal history of colonic polyps: Secondary | ICD-10-CM | POA: Diagnosis not present

## 2015-12-25 DIAGNOSIS — T18128D Food in esophagus causing other injury, subsequent encounter: Secondary | ICD-10-CM | POA: Diagnosis not present

## 2015-12-30 DIAGNOSIS — L570 Actinic keratosis: Secondary | ICD-10-CM | POA: Diagnosis not present

## 2015-12-30 DIAGNOSIS — D485 Neoplasm of uncertain behavior of skin: Secondary | ICD-10-CM | POA: Diagnosis not present

## 2015-12-30 DIAGNOSIS — C4442 Squamous cell carcinoma of skin of scalp and neck: Secondary | ICD-10-CM | POA: Diagnosis not present

## 2015-12-30 DIAGNOSIS — Z85828 Personal history of other malignant neoplasm of skin: Secondary | ICD-10-CM | POA: Diagnosis not present

## 2015-12-30 DIAGNOSIS — Z0000 Encounter for general adult medical examination without abnormal findings: Secondary | ICD-10-CM | POA: Diagnosis not present

## 2016-02-26 DIAGNOSIS — Z23 Encounter for immunization: Secondary | ICD-10-CM | POA: Diagnosis not present

## 2016-03-01 DIAGNOSIS — R1310 Dysphagia, unspecified: Secondary | ICD-10-CM | POA: Diagnosis not present

## 2016-03-01 DIAGNOSIS — D126 Benign neoplasm of colon, unspecified: Secondary | ICD-10-CM | POA: Diagnosis not present

## 2016-03-01 DIAGNOSIS — K222 Esophageal obstruction: Secondary | ICD-10-CM | POA: Diagnosis not present

## 2016-03-01 DIAGNOSIS — D123 Benign neoplasm of transverse colon: Secondary | ICD-10-CM | POA: Diagnosis not present

## 2016-03-01 DIAGNOSIS — Z86010 Personal history of colonic polyps: Secondary | ICD-10-CM | POA: Diagnosis not present

## 2016-03-01 DIAGNOSIS — K5730 Diverticulosis of large intestine without perforation or abscess without bleeding: Secondary | ICD-10-CM | POA: Diagnosis not present

## 2016-03-08 DIAGNOSIS — D126 Benign neoplasm of colon, unspecified: Secondary | ICD-10-CM | POA: Diagnosis not present

## 2016-03-17 DIAGNOSIS — I10 Essential (primary) hypertension: Secondary | ICD-10-CM | POA: Diagnosis not present

## 2016-03-17 DIAGNOSIS — Z85828 Personal history of other malignant neoplasm of skin: Secondary | ICD-10-CM | POA: Diagnosis not present

## 2016-03-17 DIAGNOSIS — Z125 Encounter for screening for malignant neoplasm of prostate: Secondary | ICD-10-CM | POA: Diagnosis not present

## 2016-03-17 DIAGNOSIS — E119 Type 2 diabetes mellitus without complications: Secondary | ICD-10-CM | POA: Diagnosis not present

## 2016-03-17 DIAGNOSIS — E784 Other hyperlipidemia: Secondary | ICD-10-CM | POA: Diagnosis not present

## 2016-03-17 DIAGNOSIS — R8299 Other abnormal findings in urine: Secondary | ICD-10-CM | POA: Diagnosis not present

## 2016-03-17 DIAGNOSIS — L570 Actinic keratosis: Secondary | ICD-10-CM | POA: Diagnosis not present

## 2016-03-24 DIAGNOSIS — N182 Chronic kidney disease, stage 2 (mild): Secondary | ICD-10-CM | POA: Diagnosis not present

## 2016-03-24 DIAGNOSIS — Z1389 Encounter for screening for other disorder: Secondary | ICD-10-CM | POA: Diagnosis not present

## 2016-03-24 DIAGNOSIS — Z0000 Encounter for general adult medical examination without abnormal findings: Secondary | ICD-10-CM | POA: Diagnosis not present

## 2016-03-24 DIAGNOSIS — K222 Esophageal obstruction: Secondary | ICD-10-CM | POA: Diagnosis not present

## 2016-03-24 DIAGNOSIS — Z86010 Personal history of colonic polyps: Secondary | ICD-10-CM | POA: Diagnosis not present

## 2016-03-24 DIAGNOSIS — E784 Other hyperlipidemia: Secondary | ICD-10-CM | POA: Diagnosis not present

## 2016-03-24 DIAGNOSIS — I10 Essential (primary) hypertension: Secondary | ICD-10-CM | POA: Diagnosis not present

## 2016-03-24 DIAGNOSIS — Q613 Polycystic kidney, unspecified: Secondary | ICD-10-CM | POA: Diagnosis not present

## 2016-03-24 DIAGNOSIS — E1129 Type 2 diabetes mellitus with other diabetic kidney complication: Secondary | ICD-10-CM | POA: Diagnosis not present

## 2016-03-24 DIAGNOSIS — Z6829 Body mass index (BMI) 29.0-29.9, adult: Secondary | ICD-10-CM | POA: Diagnosis not present

## 2016-04-25 ENCOUNTER — Encounter: Payer: Self-pay | Admitting: Dietician

## 2016-04-25 ENCOUNTER — Encounter: Payer: Medicare Other | Attending: Internal Medicine | Admitting: Dietician

## 2016-04-25 DIAGNOSIS — Z713 Dietary counseling and surveillance: Secondary | ICD-10-CM | POA: Insufficient documentation

## 2016-04-25 DIAGNOSIS — E119 Type 2 diabetes mellitus without complications: Secondary | ICD-10-CM | POA: Diagnosis not present

## 2016-04-25 DIAGNOSIS — I10 Essential (primary) hypertension: Secondary | ICD-10-CM | POA: Diagnosis not present

## 2016-04-25 DIAGNOSIS — E785 Hyperlipidemia, unspecified: Secondary | ICD-10-CM | POA: Insufficient documentation

## 2016-04-25 NOTE — Progress Notes (Signed)
  Medical Nutrition Therapy:  Appt start time: 1110 end time:  1210.   Assessment:  Primary concerns today: Patient is here today with his wife.  They would like a review about the diabetic diet.  They report that he did very well for the first couple of months since their last appointment but since have not been as carefull with food choices and has gained 15 lbs back.  Today's weight 210 lbs.  UBW 200 lbs.  His last A1C was 6.4% 02/2016.  Other hx includes HTN and hyperlipidemia.  His wife states that she is working towards a plant based diet and wants help with meal planning and how to integrate this diet for her husband.  Patient lives with his wife.  She does the shopping and cooking.  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  Preferred Learning Style:   No preference indicated   Learning Readiness:   Ready  Change in progress   MEDICATIONS: see list to include Metformin   DIETARY INTAKE:  Usual eating pattern includes 3 meals and 1-3 snacks per day.  24-hr recall:  B ( AM): cereal, 1/2 banana, skim milk and occasional toast with peanut butter or butter or rare jelly Snk ( AM): occasional nuts/raisins OR 1/2 pack NABS or fruit  L ( PM): subway or taco bell or [employer] and occasional chips Snk ( PM): similar to am snack D ( PM): meat and vegetables, salad OR pizza or spaghetti OR soup and salad Snk ( PM): popcorn or pretzels or nuts Beverages: water, diet coke, diet soda, unsweetened tea  Usual physical activity: gym 3 times per week for 1 hour 20-30 minutes cardio and 20-30 minutes weights, yard work, golf once per week.   Estimated energy needs: 1600 calories 180 g carbohydrates 120 g protein 44 g fat  Progress Towards Goal(s):  In progress.   Nutritional Diagnosis:  NB-1.1 Food and nutrition-related knowledge deficit As related to balance of carbohydrate, protein, and fat.  As evidenced by diet hx and patient report.    Intervention:   Nutrition counseling/education related to a balanced diabetic diet.  Discussed the importance of regular exercise.  Also discussed the plant based diet related to diabetes and health.  Discussed need for supplementation with a sublingual vitamin B-12 if he goes on this eating plan.  Discussed meal ideas.  Consider reading:   [name], MD Reversing Diabetes  Becoming Vegan, by Narda Amber, RD Other resources:  [facility] [place] for Responsible Medicine  Plan:  Aim for 3 Carb Choices per meal (45 grams) +/- 1 either way  Aim for 0-1 Carbs per snack if hungry  Include protein in moderation with your meals and snacks Consider reading food labels for Total Carbohydrate and Fat Grams of foods Consider  increasing your activity level by walking or gym for 30 minutes daily as tolerated Consider checking BG at alternate times per day as directed by MD  Consider taking medication as directed by MD  Teaching Method Utilized:  Visual Auditory Hands on  Handouts given during visit include:  Meal plan card  Snack list  Barriers to learning/adherence to lifestyle change: none  Demonstrated degree of understanding via:  Teach Back   Monitoring/Evaluation:  Dietary intake, exercise, and body weight prn.

## 2016-04-25 NOTE — Patient Instructions (Addendum)
Consider reading:   [name], MD Reversing Diabetes  Becoming Vegan, by Narda Amber, RD Other resources:  [facility] [place] for Responsible Medicine  Plan:  Aim for 3 Carb Choices per meal (45 grams) +/- 1 either way  Aim for 0-1 Carbs per snack if hungry  Include protein in moderation with your meals and snacks Consider reading food labels for Total Carbohydrate and Fat Grams of foods Consider  increasing your activity level by walking or gym for 30 minutes daily as tolerated Consider checking BG at alternate times per day as directed by MD  Consider taking medication as directed by MD

## 2016-05-30 DIAGNOSIS — R0602 Shortness of breath: Secondary | ICD-10-CM | POA: Diagnosis not present

## 2016-05-30 DIAGNOSIS — J208 Acute bronchitis due to other specified organisms: Secondary | ICD-10-CM | POA: Diagnosis not present

## 2016-05-30 DIAGNOSIS — R5081 Fever presenting with conditions classified elsewhere: Secondary | ICD-10-CM | POA: Diagnosis not present

## 2016-05-30 DIAGNOSIS — Z6831 Body mass index (BMI) 31.0-31.9, adult: Secondary | ICD-10-CM | POA: Diagnosis not present

## 2016-09-13 DIAGNOSIS — E119 Type 2 diabetes mellitus without complications: Secondary | ICD-10-CM | POA: Diagnosis not present

## 2016-09-13 DIAGNOSIS — E669 Obesity, unspecified: Secondary | ICD-10-CM | POA: Diagnosis not present

## 2016-09-13 DIAGNOSIS — I10 Essential (primary) hypertension: Secondary | ICD-10-CM | POA: Diagnosis not present

## 2016-09-13 DIAGNOSIS — Q612 Polycystic kidney, adult type: Secondary | ICD-10-CM | POA: Diagnosis not present

## 2016-09-22 DIAGNOSIS — I10 Essential (primary) hypertension: Secondary | ICD-10-CM | POA: Diagnosis not present

## 2016-09-22 DIAGNOSIS — E668 Other obesity: Secondary | ICD-10-CM | POA: Diagnosis not present

## 2016-09-22 DIAGNOSIS — Q613 Polycystic kidney, unspecified: Secondary | ICD-10-CM | POA: Diagnosis not present

## 2016-09-22 DIAGNOSIS — N182 Chronic kidney disease, stage 2 (mild): Secondary | ICD-10-CM | POA: Diagnosis not present

## 2016-09-22 DIAGNOSIS — Z6830 Body mass index (BMI) 30.0-30.9, adult: Secondary | ICD-10-CM | POA: Diagnosis not present

## 2016-09-22 DIAGNOSIS — E1129 Type 2 diabetes mellitus with other diabetic kidney complication: Secondary | ICD-10-CM | POA: Diagnosis not present

## 2016-09-22 DIAGNOSIS — E784 Other hyperlipidemia: Secondary | ICD-10-CM | POA: Diagnosis not present

## 2016-09-22 DIAGNOSIS — Z23 Encounter for immunization: Secondary | ICD-10-CM | POA: Diagnosis not present

## 2017-01-25 DIAGNOSIS — L570 Actinic keratosis: Secondary | ICD-10-CM | POA: Diagnosis not present

## 2017-01-25 DIAGNOSIS — Z85828 Personal history of other malignant neoplasm of skin: Secondary | ICD-10-CM | POA: Diagnosis not present

## 2017-01-25 DIAGNOSIS — L820 Inflamed seborrheic keratosis: Secondary | ICD-10-CM | POA: Diagnosis not present

## 2017-01-25 DIAGNOSIS — D224 Melanocytic nevi of scalp and neck: Secondary | ICD-10-CM | POA: Diagnosis not present

## 2017-02-11 DIAGNOSIS — Z23 Encounter for immunization: Secondary | ICD-10-CM | POA: Diagnosis not present

## 2017-04-07 DIAGNOSIS — I10 Essential (primary) hypertension: Secondary | ICD-10-CM | POA: Diagnosis not present

## 2017-04-07 DIAGNOSIS — E7849 Other hyperlipidemia: Secondary | ICD-10-CM | POA: Diagnosis not present

## 2017-04-07 DIAGNOSIS — E1129 Type 2 diabetes mellitus with other diabetic kidney complication: Secondary | ICD-10-CM | POA: Diagnosis not present

## 2017-04-07 DIAGNOSIS — R82998 Other abnormal findings in urine: Secondary | ICD-10-CM | POA: Diagnosis not present

## 2017-04-07 DIAGNOSIS — Z125 Encounter for screening for malignant neoplasm of prostate: Secondary | ICD-10-CM | POA: Diagnosis not present

## 2017-04-18 DIAGNOSIS — Z1389 Encounter for screening for other disorder: Secondary | ICD-10-CM | POA: Diagnosis not present

## 2017-04-18 DIAGNOSIS — N182 Chronic kidney disease, stage 2 (mild): Secondary | ICD-10-CM | POA: Diagnosis not present

## 2017-04-18 DIAGNOSIS — E1129 Type 2 diabetes mellitus with other diabetic kidney complication: Secondary | ICD-10-CM | POA: Diagnosis not present

## 2017-04-18 DIAGNOSIS — E668 Other obesity: Secondary | ICD-10-CM | POA: Diagnosis not present

## 2017-04-18 DIAGNOSIS — I10 Essential (primary) hypertension: Secondary | ICD-10-CM | POA: Diagnosis not present

## 2017-04-18 DIAGNOSIS — E7849 Other hyperlipidemia: Secondary | ICD-10-CM | POA: Diagnosis not present

## 2017-04-18 DIAGNOSIS — Z86010 Personal history of colonic polyps: Secondary | ICD-10-CM | POA: Diagnosis not present

## 2017-04-18 DIAGNOSIS — Z0000 Encounter for general adult medical examination without abnormal findings: Secondary | ICD-10-CM | POA: Diagnosis not present

## 2017-04-18 DIAGNOSIS — Z6830 Body mass index (BMI) 30.0-30.9, adult: Secondary | ICD-10-CM | POA: Diagnosis not present

## 2017-04-18 DIAGNOSIS — B351 Tinea unguium: Secondary | ICD-10-CM | POA: Diagnosis not present

## 2017-04-18 DIAGNOSIS — Q613 Polycystic kidney, unspecified: Secondary | ICD-10-CM | POA: Diagnosis not present

## 2017-04-28 DIAGNOSIS — Z1212 Encounter for screening for malignant neoplasm of rectum: Secondary | ICD-10-CM | POA: Diagnosis not present

## 2017-04-28 DIAGNOSIS — E119 Type 2 diabetes mellitus without complications: Secondary | ICD-10-CM | POA: Diagnosis not present

## 2017-05-16 DIAGNOSIS — K222 Esophageal obstruction: Secondary | ICD-10-CM | POA: Diagnosis not present

## 2017-05-16 DIAGNOSIS — R509 Fever, unspecified: Secondary | ICD-10-CM | POA: Diagnosis not present

## 2017-05-16 DIAGNOSIS — T18128A Food in esophagus causing other injury, initial encounter: Secondary | ICD-10-CM | POA: Diagnosis not present

## 2017-05-16 DIAGNOSIS — R030 Elevated blood-pressure reading, without diagnosis of hypertension: Secondary | ICD-10-CM | POA: Diagnosis not present

## 2017-05-18 ENCOUNTER — Other Ambulatory Visit: Payer: Self-pay | Admitting: Physician Assistant

## 2017-05-18 DIAGNOSIS — K222 Esophageal obstruction: Secondary | ICD-10-CM | POA: Diagnosis not present

## 2017-05-18 DIAGNOSIS — R1310 Dysphagia, unspecified: Secondary | ICD-10-CM | POA: Diagnosis not present

## 2017-05-24 ENCOUNTER — Ambulatory Visit
Admission: RE | Admit: 2017-05-24 | Discharge: 2017-05-24 | Disposition: A | Discharge status: Home / Self Care | Payer: 59 | Source: Ambulatory Visit | Attending: Physician Assistant | Admitting: Physician Assistant

## 2017-05-24 DIAGNOSIS — K222 Esophageal obstruction: Secondary | ICD-10-CM

## 2017-05-24 DIAGNOSIS — R1310 Dysphagia, unspecified: Secondary | ICD-10-CM

## 2017-05-31 DIAGNOSIS — K3189 Other diseases of stomach and duodenum: Secondary | ICD-10-CM | POA: Diagnosis not present

## 2017-05-31 DIAGNOSIS — K317 Polyp of stomach and duodenum: Secondary | ICD-10-CM | POA: Diagnosis not present

## 2017-05-31 DIAGNOSIS — R1310 Dysphagia, unspecified: Secondary | ICD-10-CM | POA: Diagnosis not present

## 2017-05-31 DIAGNOSIS — K210 Gastro-esophageal reflux disease with esophagitis: Secondary | ICD-10-CM | POA: Diagnosis not present

## 2017-05-31 DIAGNOSIS — R933 Abnormal findings on diagnostic imaging of other parts of digestive tract: Secondary | ICD-10-CM | POA: Diagnosis not present

## 2017-05-31 DIAGNOSIS — K449 Diaphragmatic hernia without obstruction or gangrene: Secondary | ICD-10-CM | POA: Diagnosis not present

## 2017-06-06 DIAGNOSIS — K317 Polyp of stomach and duodenum: Secondary | ICD-10-CM | POA: Diagnosis not present

## 2017-06-21 DIAGNOSIS — K210 Gastro-esophageal reflux disease with esophagitis: Secondary | ICD-10-CM | POA: Diagnosis not present

## 2017-06-21 DIAGNOSIS — R1310 Dysphagia, unspecified: Secondary | ICD-10-CM | POA: Diagnosis not present

## 2017-07-21 DIAGNOSIS — M79645 Pain in left finger(s): Secondary | ICD-10-CM | POA: Diagnosis not present

## 2017-07-24 DIAGNOSIS — K2930 Chronic superficial gastritis without bleeding: Secondary | ICD-10-CM | POA: Diagnosis not present

## 2017-07-24 DIAGNOSIS — K2980 Duodenitis without bleeding: Secondary | ICD-10-CM | POA: Diagnosis not present

## 2017-07-24 DIAGNOSIS — K210 Gastro-esophageal reflux disease with esophagitis: Secondary | ICD-10-CM | POA: Diagnosis not present

## 2017-07-24 DIAGNOSIS — K449 Diaphragmatic hernia without obstruction or gangrene: Secondary | ICD-10-CM | POA: Diagnosis not present

## 2017-07-24 DIAGNOSIS — R1310 Dysphagia, unspecified: Secondary | ICD-10-CM | POA: Diagnosis not present

## 2017-07-27 DIAGNOSIS — K2980 Duodenitis without bleeding: Secondary | ICD-10-CM | POA: Diagnosis not present

## 2017-07-27 DIAGNOSIS — K210 Gastro-esophageal reflux disease with esophagitis: Secondary | ICD-10-CM | POA: Diagnosis not present

## 2017-08-11 DIAGNOSIS — D225 Melanocytic nevi of trunk: Secondary | ICD-10-CM | POA: Diagnosis not present

## 2017-08-11 DIAGNOSIS — D2271 Melanocytic nevi of right lower limb, including hip: Secondary | ICD-10-CM | POA: Diagnosis not present

## 2017-08-11 DIAGNOSIS — Z85828 Personal history of other malignant neoplasm of skin: Secondary | ICD-10-CM | POA: Diagnosis not present

## 2017-08-11 DIAGNOSIS — L570 Actinic keratosis: Secondary | ICD-10-CM | POA: Diagnosis not present

## 2017-08-11 DIAGNOSIS — D1801 Hemangioma of skin and subcutaneous tissue: Secondary | ICD-10-CM | POA: Diagnosis not present

## 2017-08-11 DIAGNOSIS — L821 Other seborrheic keratosis: Secondary | ICD-10-CM | POA: Diagnosis not present

## 2017-08-11 DIAGNOSIS — L218 Other seborrheic dermatitis: Secondary | ICD-10-CM | POA: Diagnosis not present

## 2017-08-24 DIAGNOSIS — E668 Other obesity: Secondary | ICD-10-CM | POA: Diagnosis not present

## 2017-08-24 DIAGNOSIS — Q613 Polycystic kidney, unspecified: Secondary | ICD-10-CM | POA: Diagnosis not present

## 2017-08-24 DIAGNOSIS — E1129 Type 2 diabetes mellitus with other diabetic kidney complication: Secondary | ICD-10-CM | POA: Diagnosis not present

## 2017-08-24 DIAGNOSIS — E7849 Other hyperlipidemia: Secondary | ICD-10-CM | POA: Diagnosis not present

## 2017-08-24 DIAGNOSIS — Z6830 Body mass index (BMI) 30.0-30.9, adult: Secondary | ICD-10-CM | POA: Diagnosis not present

## 2017-08-24 DIAGNOSIS — N182 Chronic kidney disease, stage 2 (mild): Secondary | ICD-10-CM | POA: Diagnosis not present

## 2017-08-24 DIAGNOSIS — I10 Essential (primary) hypertension: Secondary | ICD-10-CM | POA: Diagnosis not present

## 2017-08-24 DIAGNOSIS — B351 Tinea unguium: Secondary | ICD-10-CM | POA: Diagnosis not present

## 2017-08-24 DIAGNOSIS — R1310 Dysphagia, unspecified: Secondary | ICD-10-CM | POA: Diagnosis not present

## 2017-08-24 DIAGNOSIS — Z1389 Encounter for screening for other disorder: Secondary | ICD-10-CM | POA: Diagnosis not present

## 2017-11-01 DIAGNOSIS — J209 Acute bronchitis, unspecified: Secondary | ICD-10-CM | POA: Diagnosis not present

## 2017-11-01 DIAGNOSIS — Z0189 Encounter for other specified special examinations: Secondary | ICD-10-CM | POA: Diagnosis not present

## 2017-11-01 DIAGNOSIS — E119 Type 2 diabetes mellitus without complications: Secondary | ICD-10-CM | POA: Diagnosis not present

## 2017-11-01 DIAGNOSIS — I10 Essential (primary) hypertension: Secondary | ICD-10-CM | POA: Diagnosis not present

## 2017-11-01 DIAGNOSIS — E7849 Other hyperlipidemia: Secondary | ICD-10-CM | POA: Diagnosis not present

## 2017-11-01 DIAGNOSIS — E782 Mixed hyperlipidemia: Secondary | ICD-10-CM | POA: Diagnosis not present

## 2017-11-01 DIAGNOSIS — R05 Cough: Secondary | ICD-10-CM | POA: Diagnosis not present

## 2017-11-01 DIAGNOSIS — Z6830 Body mass index (BMI) 30.0-30.9, adult: Secondary | ICD-10-CM | POA: Diagnosis not present

## 2017-12-25 DIAGNOSIS — Z7984 Long term (current) use of oral hypoglycemic drugs: Secondary | ICD-10-CM | POA: Diagnosis not present

## 2017-12-25 DIAGNOSIS — Z23 Encounter for immunization: Secondary | ICD-10-CM | POA: Diagnosis not present

## 2017-12-25 DIAGNOSIS — I10 Essential (primary) hypertension: Secondary | ICD-10-CM | POA: Diagnosis not present

## 2017-12-25 DIAGNOSIS — K219 Gastro-esophageal reflux disease without esophagitis: Secondary | ICD-10-CM | POA: Diagnosis not present

## 2017-12-25 DIAGNOSIS — S91312A Laceration without foreign body, left foot, initial encounter: Secondary | ICD-10-CM | POA: Diagnosis not present

## 2017-12-25 DIAGNOSIS — E7800 Pure hypercholesterolemia, unspecified: Secondary | ICD-10-CM | POA: Diagnosis not present

## 2017-12-25 DIAGNOSIS — Z79899 Other long term (current) drug therapy: Secondary | ICD-10-CM | POA: Diagnosis not present

## 2017-12-25 DIAGNOSIS — E119 Type 2 diabetes mellitus without complications: Secondary | ICD-10-CM | POA: Diagnosis not present

## 2018-01-01 DIAGNOSIS — E7849 Other hyperlipidemia: Secondary | ICD-10-CM | POA: Diagnosis not present

## 2018-01-01 DIAGNOSIS — N182 Chronic kidney disease, stage 2 (mild): Secondary | ICD-10-CM | POA: Diagnosis not present

## 2018-01-01 DIAGNOSIS — S91319A Laceration without foreign body, unspecified foot, initial encounter: Secondary | ICD-10-CM | POA: Diagnosis not present

## 2018-01-01 DIAGNOSIS — I10 Essential (primary) hypertension: Secondary | ICD-10-CM | POA: Diagnosis not present

## 2018-01-01 DIAGNOSIS — E1129 Type 2 diabetes mellitus with other diabetic kidney complication: Secondary | ICD-10-CM | POA: Diagnosis not present

## 2018-01-01 DIAGNOSIS — Z6830 Body mass index (BMI) 30.0-30.9, adult: Secondary | ICD-10-CM | POA: Diagnosis not present

## 2018-01-08 DIAGNOSIS — S91319A Laceration without foreign body, unspecified foot, initial encounter: Secondary | ICD-10-CM | POA: Diagnosis not present

## 2018-01-08 DIAGNOSIS — I10 Essential (primary) hypertension: Secondary | ICD-10-CM | POA: Diagnosis not present

## 2018-01-08 DIAGNOSIS — Z4802 Encounter for removal of sutures: Secondary | ICD-10-CM | POA: Diagnosis not present

## 2018-01-08 DIAGNOSIS — N182 Chronic kidney disease, stage 2 (mild): Secondary | ICD-10-CM | POA: Diagnosis not present

## 2018-01-18 DIAGNOSIS — Z85828 Personal history of other malignant neoplasm of skin: Secondary | ICD-10-CM | POA: Diagnosis not present

## 2018-01-18 DIAGNOSIS — L570 Actinic keratosis: Secondary | ICD-10-CM | POA: Diagnosis not present

## 2018-01-18 DIAGNOSIS — L821 Other seborrheic keratosis: Secondary | ICD-10-CM | POA: Diagnosis not present

## 2018-01-27 DIAGNOSIS — Z23 Encounter for immunization: Secondary | ICD-10-CM | POA: Diagnosis not present

## 2018-02-22 DIAGNOSIS — E11621 Type 2 diabetes mellitus with foot ulcer: Secondary | ICD-10-CM | POA: Diagnosis not present

## 2018-02-22 DIAGNOSIS — E1129 Type 2 diabetes mellitus with other diabetic kidney complication: Secondary | ICD-10-CM | POA: Diagnosis not present

## 2018-02-22 DIAGNOSIS — Z6830 Body mass index (BMI) 30.0-30.9, adult: Secondary | ICD-10-CM | POA: Diagnosis not present

## 2018-03-01 DIAGNOSIS — E11621 Type 2 diabetes mellitus with foot ulcer: Secondary | ICD-10-CM | POA: Diagnosis not present

## 2018-03-07 DIAGNOSIS — E11621 Type 2 diabetes mellitus with foot ulcer: Secondary | ICD-10-CM | POA: Diagnosis not present

## 2018-03-07 DIAGNOSIS — Z6830 Body mass index (BMI) 30.0-30.9, adult: Secondary | ICD-10-CM | POA: Diagnosis not present

## 2018-03-14 DIAGNOSIS — Z6830 Body mass index (BMI) 30.0-30.9, adult: Secondary | ICD-10-CM | POA: Diagnosis not present

## 2018-03-14 DIAGNOSIS — E11621 Type 2 diabetes mellitus with foot ulcer: Secondary | ICD-10-CM | POA: Diagnosis not present

## 2018-03-21 DIAGNOSIS — E11621 Type 2 diabetes mellitus with foot ulcer: Secondary | ICD-10-CM | POA: Diagnosis not present

## 2018-03-21 DIAGNOSIS — Z6830 Body mass index (BMI) 30.0-30.9, adult: Secondary | ICD-10-CM | POA: Diagnosis not present

## 2018-03-23 DIAGNOSIS — J209 Acute bronchitis, unspecified: Secondary | ICD-10-CM | POA: Diagnosis not present

## 2018-03-23 DIAGNOSIS — R05 Cough: Secondary | ICD-10-CM | POA: Diagnosis not present

## 2018-04-17 DIAGNOSIS — Z125 Encounter for screening for malignant neoplasm of prostate: Secondary | ICD-10-CM | POA: Diagnosis not present

## 2018-04-17 DIAGNOSIS — E11621 Type 2 diabetes mellitus with foot ulcer: Secondary | ICD-10-CM | POA: Diagnosis not present

## 2018-04-17 DIAGNOSIS — E7849 Other hyperlipidemia: Secondary | ICD-10-CM | POA: Diagnosis not present

## 2018-04-17 DIAGNOSIS — R82998 Other abnormal findings in urine: Secondary | ICD-10-CM | POA: Diagnosis not present

## 2018-04-24 DIAGNOSIS — K222 Esophageal obstruction: Secondary | ICD-10-CM | POA: Diagnosis not present

## 2018-04-24 DIAGNOSIS — I10 Essential (primary) hypertension: Secondary | ICD-10-CM | POA: Diagnosis not present

## 2018-04-24 DIAGNOSIS — E7849 Other hyperlipidemia: Secondary | ICD-10-CM | POA: Diagnosis not present

## 2018-04-24 DIAGNOSIS — Z125 Encounter for screening for malignant neoplasm of prostate: Secondary | ICD-10-CM | POA: Diagnosis not present

## 2018-04-24 DIAGNOSIS — E668 Other obesity: Secondary | ICD-10-CM | POA: Diagnosis not present

## 2018-04-24 DIAGNOSIS — Z1389 Encounter for screening for other disorder: Secondary | ICD-10-CM | POA: Diagnosis not present

## 2018-04-24 DIAGNOSIS — Z0000 Encounter for general adult medical examination without abnormal findings: Secondary | ICD-10-CM | POA: Diagnosis not present

## 2018-04-24 DIAGNOSIS — E1129 Type 2 diabetes mellitus with other diabetic kidney complication: Secondary | ICD-10-CM | POA: Diagnosis not present

## 2018-04-24 DIAGNOSIS — Z6830 Body mass index (BMI) 30.0-30.9, adult: Secondary | ICD-10-CM | POA: Diagnosis not present

## 2018-04-24 DIAGNOSIS — Q613 Polycystic kidney, unspecified: Secondary | ICD-10-CM | POA: Diagnosis not present

## 2018-04-24 DIAGNOSIS — N183 Chronic kidney disease, stage 3 (moderate): Secondary | ICD-10-CM | POA: Diagnosis not present

## 2018-04-24 DIAGNOSIS — Z86010 Personal history of colonic polyps: Secondary | ICD-10-CM | POA: Diagnosis not present

## 2018-04-26 DIAGNOSIS — Z1212 Encounter for screening for malignant neoplasm of rectum: Secondary | ICD-10-CM | POA: Diagnosis not present

## 2018-06-06 DIAGNOSIS — E119 Type 2 diabetes mellitus without complications: Secondary | ICD-10-CM | POA: Diagnosis not present

## 2018-07-11 DIAGNOSIS — J069 Acute upper respiratory infection, unspecified: Secondary | ICD-10-CM | POA: Diagnosis not present

## 2018-07-11 DIAGNOSIS — Z6830 Body mass index (BMI) 30.0-30.9, adult: Secondary | ICD-10-CM | POA: Diagnosis not present

## 2018-07-11 DIAGNOSIS — R05 Cough: Secondary | ICD-10-CM | POA: Diagnosis not present

## 2018-07-19 DIAGNOSIS — Z6830 Body mass index (BMI) 30.0-30.9, adult: Secondary | ICD-10-CM | POA: Diagnosis not present

## 2018-07-19 DIAGNOSIS — J069 Acute upper respiratory infection, unspecified: Secondary | ICD-10-CM | POA: Diagnosis not present

## 2018-08-22 DIAGNOSIS — E1129 Type 2 diabetes mellitus with other diabetic kidney complication: Secondary | ICD-10-CM | POA: Diagnosis not present

## 2018-08-22 DIAGNOSIS — E785 Hyperlipidemia, unspecified: Secondary | ICD-10-CM | POA: Diagnosis not present

## 2018-08-22 DIAGNOSIS — N183 Chronic kidney disease, stage 3 (moderate): Secondary | ICD-10-CM | POA: Diagnosis not present

## 2018-08-22 DIAGNOSIS — I10 Essential (primary) hypertension: Secondary | ICD-10-CM | POA: Diagnosis not present

## 2018-08-22 DIAGNOSIS — Q613 Polycystic kidney, unspecified: Secondary | ICD-10-CM | POA: Diagnosis not present

## 2018-08-22 DIAGNOSIS — E669 Obesity, unspecified: Secondary | ICD-10-CM | POA: Diagnosis not present

## 2018-08-29 DIAGNOSIS — Z85828 Personal history of other malignant neoplasm of skin: Secondary | ICD-10-CM | POA: Diagnosis not present

## 2018-08-29 DIAGNOSIS — D044 Carcinoma in situ of skin of scalp and neck: Secondary | ICD-10-CM | POA: Diagnosis not present

## 2018-08-29 DIAGNOSIS — L570 Actinic keratosis: Secondary | ICD-10-CM | POA: Diagnosis not present

## 2018-08-29 DIAGNOSIS — D485 Neoplasm of uncertain behavior of skin: Secondary | ICD-10-CM | POA: Diagnosis not present

## 2018-09-05 DIAGNOSIS — Z85828 Personal history of other malignant neoplasm of skin: Secondary | ICD-10-CM | POA: Diagnosis not present

## 2018-09-05 DIAGNOSIS — D044 Carcinoma in situ of skin of scalp and neck: Secondary | ICD-10-CM | POA: Diagnosis not present

## 2018-10-03 DIAGNOSIS — D1801 Hemangioma of skin and subcutaneous tissue: Secondary | ICD-10-CM | POA: Diagnosis not present

## 2018-10-03 DIAGNOSIS — Z85828 Personal history of other malignant neoplasm of skin: Secondary | ICD-10-CM | POA: Diagnosis not present

## 2018-10-03 DIAGNOSIS — L814 Other melanin hyperpigmentation: Secondary | ICD-10-CM | POA: Diagnosis not present

## 2018-10-03 DIAGNOSIS — L821 Other seborrheic keratosis: Secondary | ICD-10-CM | POA: Diagnosis not present

## 2018-10-03 DIAGNOSIS — L570 Actinic keratosis: Secondary | ICD-10-CM | POA: Diagnosis not present

## 2018-10-03 DIAGNOSIS — D2271 Melanocytic nevi of right lower limb, including hip: Secondary | ICD-10-CM | POA: Diagnosis not present

## 2018-10-03 DIAGNOSIS — D225 Melanocytic nevi of trunk: Secondary | ICD-10-CM | POA: Diagnosis not present

## 2018-10-25 ENCOUNTER — Telehealth (INDEPENDENT_AMBULATORY_CARE_PROVIDER_SITE_OTHER): Payer: Medicare Other

## 2018-10-25 DIAGNOSIS — I10 Essential (primary) hypertension: Secondary | ICD-10-CM | POA: Diagnosis not present

## 2018-10-25 NOTE — Telephone Encounter (Signed)
It is very good decision. Happy to see if not controlled going forward. Remind to bring PCP labs on visits

## 2018-10-25 NOTE — Telephone Encounter (Signed)
Pt states that his bp has been elevated and the top # yesterday was 174. He called pcp whom changed his med from losartan hctz to Olmesartan hctz 40-12.5 qd. He wanted to make sure since you were his heart Dr. If that is ok and is there anything else he needs to be doing. Please review and advise.//ah

## 2018-10-26 NOTE — Telephone Encounter (Signed)
Pt already has a appt scheduled for 6/15 @ 1pm and pt wife wants to know if its ok for him to mow the lawn and take long walks even though his bp has been high? She wants to know if it ok for him to wait that long he has no symptoms assoc with the elevated bp just a bit concerned that's all

## 2018-10-26 NOTE — Telephone Encounter (Signed)
Lmom with details

## 2018-10-26 NOTE — Telephone Encounter (Signed)
Yes it is good to do it as exercise will bring down BP unless BP > 160/100, then not to exercise

## 2018-11-01 ENCOUNTER — Other Ambulatory Visit: Payer: Self-pay | Admitting: Cardiology

## 2018-11-01 MED ORDER — AMLODIPINE BESYLATE 5 MG PO TABS: 5.0000 mg | ORAL_TABLET | Freq: Every day | ORAL | 0 refills | Status: DC

## 2018-11-01 NOTE — Progress Notes (Signed)
Patient's wife called me stating that blood pressure has been high, systolic greater than 575-051 mmHg.  He is asymptomatic but she has been extremely concerned as patient's parents died in their 22s with vascular events.  Patient was offered amlodipine by PCP, however patient has an appointment to see me tomorrow and wanted to have the prescription sent in by the "specialist"   I convinced her that amlodipine would be a good choice and prescription has been sent for 5 mg and additional 5 mg to be taken if systolic blood pressure greater than 1 50 mmHg.

## 2018-11-01 NOTE — Telephone Encounter (Signed)
Pt wife called stated that his bp has spiked to 164/69 pulse 52; pt is having HA's, palps; Pt wife is concerned that something is not right and he has been on the benicar with the diuretic and she says this isnt normal for him; Pt did call Dr.SHaw but they did not call him back;  [phone]

## 2018-11-01 NOTE — Telephone Encounter (Signed)
Rx for amlodipine sent.  Please see prior documentation.  <10 min tel visit encounter

## 2018-11-05 ENCOUNTER — Ambulatory Visit (INDEPENDENT_AMBULATORY_CARE_PROVIDER_SITE_OTHER): Payer: Medicare Other | Admitting: Cardiology

## 2018-11-05 ENCOUNTER — Encounter: Payer: Self-pay | Admitting: Cardiology

## 2018-11-05 ENCOUNTER — Other Ambulatory Visit: Payer: Self-pay

## 2018-11-05 VITALS — BP 151/65 | HR 73 | Ht 71.0 in | Wt 204.0 lb

## 2018-11-05 DIAGNOSIS — E782 Mixed hyperlipidemia: Secondary | ICD-10-CM | POA: Diagnosis not present

## 2018-11-05 DIAGNOSIS — E119 Type 2 diabetes mellitus without complications: Secondary | ICD-10-CM | POA: Diagnosis not present

## 2018-11-05 DIAGNOSIS — Q613 Polycystic kidney, unspecified: Secondary | ICD-10-CM

## 2018-11-05 DIAGNOSIS — I10 Essential (primary) hypertension: Secondary | ICD-10-CM

## 2018-11-05 HISTORY — DX: Polycystic kidney, unspecified: Q61.3

## 2018-11-05 HISTORY — DX: Mixed hyperlipidemia: E78.2

## 2018-11-05 HISTORY — DX: Type 2 diabetes mellitus without complications: E11.9

## 2018-11-05 MED ORDER — AMLODIPINE BESYLATE 10 MG PO TABS: 10.0000 mg | ORAL_TABLET | Freq: Every day | ORAL | 1 refills | Status: DC

## 2018-11-05 NOTE — Progress Notes (Signed)
Virtual Visit via Video Note: This visit type was conducted due to national recommendations for restrictions regarding the COVID-19 Pandemic (e.g. social distancing).  This format is felt to be most appropriate for this patient at this time.  All issues noted in this document were discussed and addressed.  No physical exam was performed (except for noted visual exam findings with Telehealth visits).  The patient has consented to conduct a Telehealth visit and understands insurance will be billed.   I connected with@, on 11/05/18 at  by a video enabled telemedicine application and verified that I am speaking with the correct person using two identifiers.   I discussed the limitations of evaluation and management by telemedicine and the availability of in person appointments. The patient expressed understanding and agreed to proceed.   I have discussed with patient regarding the safety during COVID Pandemic and steps and precautions to be taken including social distancing, frequent hand wash and use of detergent soap, gels with the patient. I asked the patient to avoid touching mouth, nose, eyes, ears with the hands. I encouraged regular walking around the neighborhood and exercise and regular diet, as long as social distancing can be maintained.  Primary Physician/Referring:  Marton Redwood, MD  Patient ID: Joseph Nguyen, male    DOB: 11/20/1950, 68 y.o.   MRN: [id number]  Chief Complaint  Patient presents with   Hypertension    1 YEAR F/U    Hyperlipidemia    HPI: Joseph Nguyen  is a 68 y.o. male  with hypertension, hyperlipidemia, controlled diabetes, who I had seen about a year ago, had been doing well but recently has noticed uncontrolled hypertension.  She also has polycystic kidney disease.  His wife had called me over the weekend stating that the blood pressure has been markedly elevated. Today he states his  BP jumped to 160 to 170. Also has noted his HR has decreased to 50s.   He  states that he noticed headache frequently and also notices fatigue, Climbing one flight of stairs causes palpitations but no chest pain. He has been walking regularly for 2-4 miles three times a week and riding bicycling as well.    Past Medical History:  Diagnosis Date   Controlled type 2 diabetes mellitus without complication ([place]) 8/83/2549   DM (diabetes mellitus) ([place])    Essential hypertension 07/27/2011   Hiatal hernia    Hypercholesterolemia    Kidney disease    Polycystic,    Mixed hyperlipidemia 11/05/2018   Obesity    Polycystic kidney disease 11/05/2018    Past Surgical History:  Procedure Laterality Date   CARDIOVASCULAR STRESS TEST  10-18-2005   EF 79%   TUMOR REMOVAL  1968   Left Breast Benign   Social History   Socioeconomic History   Marital status: Married    Spouse name: Not on file   Number of children: Not on file   Years of education: Not on file   Highest education level: Not on file  Occupational History   Not on file  Social Needs   Financial resource strain: Not on file   Food insecurity    Worry: Not on file    Inability: Not on file   Transportation needs    Medical: Not on file    Non-medical: Not on file  Tobacco Use   Smoking status: Former Smoker   Smokeless tobacco: Never Used   Tobacco comment: Quite 86yr+ ago  Substance and Sexual Activity  Alcohol use: Not on file   Drug use: Not on file   Sexual activity: Not on file  Lifestyle   Physical activity    Days per week: Not on file    Minutes per session: Not on file   Stress: Not on file  Relationships   Social connections    Talks on phone: Not on file    Gets together: Not on file    Attends religious service: Not on file    Active member of club or organization: Not on file    Attends meetings of clubs or organizations: Not on file    Relationship status: Not on file   Intimate partner violence    Fear of current or ex partner: Not on file     Emotionally abused: Not on file    Physically abused: Not on file    Forced sexual activity: Not on file  Other Topics Concern   Not on file  Social History Narrative   Not on file   Review of Systems  Constitution: Positive for malaise/fatigue (one month). Negative for chills, decreased appetite and weight gain.  Cardiovascular: Negative for leg swelling and syncope.  Respiratory: Positive for snoring (occasional). Negative for sleep disturbances due to breathing and wheezing.   Endocrine: Negative for cold intolerance.  Hematologic/Lymphatic: Does not bruise/bleed easily.  Musculoskeletal: Negative for joint swelling.  Gastrointestinal: Negative for abdominal pain, anorexia, change in bowel habit, hematochezia and melena.  Neurological: Positive for headaches (mild). Negative for light-headedness.  Psychiatric/Behavioral: Negative for depression and substance abuse.  All other systems reviewed and are negative.     Objective  Blood pressure (!) 151/65, pulse 73, height _0  (1.803 m), weight 204 lb (92.5 kg). Body mass index is 28.45 kg/m.  Physical exam not performed or limited due to virtual visit.  Patient appeared to be in no distress, Neck was supple, respiration was not labored.  Please see exam details from prior visit is as below.    Physical Exam  Constitutional: He appears well-developed and well-nourished. No distress.  HENT:  Head: Atraumatic.  Eyes: Conjunctivae are normal.  Neck: Neck supple. No JVD present. No thyromegaly present.  Cardiovascular: Normal rate, regular rhythm, normal heart sounds and intact distal pulses. Exam reveals no gallop.  No murmur heard. Pulmonary/Chest: Effort normal and breath sounds normal.  Abdominal: Soft. Bowel sounds are normal.  Musculoskeletal: Normal range of motion.  Neurological: He is alert.  Skin: Skin is warm and dry.  Psychiatric: He has a normal mood and affect.   Radiology: No results found.  Laboratory  examination:   04/07/2017: Serum glucose 165 mg, BUN 23, creatinine 1.2, eGFR 60 ML, potassium 4.2. CBC normal. Total cholesterol 138, triglycerides 145, HDL 32, LDL 77. Non-HDL cholesterol 106. Apo B normal at 82, TSH normal, PSA normal.    Medications   Medications Discontinued During This Encounter  Medication Reason   losartan-hydrochlorothiazide (HYZAAR) 100-12.5 MG per tablet Error   omega-3 acid ethyl esters (LOVAZA) 1 g capsule Error   typhoid (VIVOTIF) DR capsule Error   Turmeric 1053 MG TABS Error   amLODipine (NORVASC) 5 MG tablet    Current Meds  Medication Sig   amLODipine (NORVASC) 10 MG tablet Take 1 tablet (10 mg total) by mouth daily after supper. Take one additional tablet if SBP > 150 mm Hg   aspirin 325 MG tablet Take 325 mg by mouth daily.   BIOGAIA PROBIOTIC (BIOGAIA PROBIOTIC) LIQD Take by mouth daily at  8 pm.   cholecalciferol (VITAMIN D3) 25 MCG (1000 UT) tablet Take 1,000 Units by mouth daily.   Coenzyme Q10 (COQ10) 100 MG CAPS Take by mouth.   metFORMIN (GLUCOPHAGE) 1000 MG tablet Take 1,000 mg by mouth 2 (two) times daily with a meal.    multivitamin-lutein (OCUVITE-LUTEIN) CAPS capsule Take 1 capsule by mouth daily.   NON FORMULARY Take 1 tablet by mouth daily. cinsulin   olmesartan-hydrochlorothiazide (BENICAR HCT) 40-12.5 MG tablet Take 1 tablet by mouth daily.   Omega-3 Fatty Acids (FISH OIL) 1000 MG CAPS Take by mouth.   omeprazole (PRILOSEC) 40 MG capsule Take 40 mg by mouth daily.   rosuvastatin (CRESTOR) 10 MG tablet take 1 tablet by mouth once daily   vitamin C (ASCORBIC ACID) 500 MG tablet Take 500 mg by mouth daily.   vitamin E 100 UNIT capsule Take by mouth daily.   [DISCONTINUED] amLODipine (NORVASC) 5 MG tablet Take 1 tablet (5 mg total) by mouth daily. Take one additional tablet if SBP > 150 mm Hg    Cardiac Studies:   Remote stress test 2007: Negative for ischemia  Assessment   Essential hypertension    Polycystic kidney disease   Mixed hyperlipidemia   Controlled type 2 diabetes mellitus without complication, without long-term current use of insulin ([place])     EKG 11/01/2017: Normal sinus rhythm at rate of 60 bpm, normal axis. Incomplete right branch block, normal EKG.  Recommendations:   Patient presents for evaluation of recent spike in blood pressure about a month ago, had also noticed low heart rate.  However upon discussions with the patient and his wife, they have been walking anywhere from 2 to 4 miles at least 2-3 times a week and also has been bicycling some as well and has not had any chest pain or decreased exercise tolerance.  He has noticed occasional palpitation when he walks up a flight of stairs. No dizziness or syncope or decreased exercise tolerance.   Over the weekend had given him a prescription for amlodipine, 5 mg.  Increased to 10 mg in the evening and he will continue with Benicar HCT in the morning.  I do not suspect atrial fibrillation, neither but I suspect angina pectoris.  His diabetes is well controlled, last A1c 6.9%.  I do not have his recent lipids.  With regard to polycystic kidney disease, states that there is very mild renal insufficiency that is been stable.  Hence I do not suspect secondary hypertension.  He is seeing Dr. Earlie Server for nephrology evaluations..  Patient and his wife are both present, they are wondering whether he should have a stress test, I have advised him that there is presently no indication but on office visit in 6 weeks we can make a decision whether he needs any further cardiac work-up.  At this point I advised him to continue to exercise regularly and monitor for symptoms.  This was a 40-minute office visit encounter.  Greater than 50% time was spent in face-to-face interaction.  Adrian Prows, MD, [facility] 11/05/2018, 1:27 PM [facility] [place] Pager: [phone] Office: [phone] If no answer Cell [phone]

## 2018-11-07 ENCOUNTER — Telehealth: Payer: Self-pay

## 2018-11-07 NOTE — Telephone Encounter (Signed)
Let him try Hydralazine 50 mg TID for now and see how he does

## 2018-11-07 NOTE — Telephone Encounter (Signed)
Pt wife called and said that his BP is still high still high 162/78 pulse 72  amlodpine increased to 10 mg since Monday 11/05/18. He takes amlodipine at night and olmesartan in the morning   Patient is leaving town wanted to know if they need to come sooner or wait the 4 weeks.

## 2018-11-08 ENCOUNTER — Other Ambulatory Visit: Payer: Self-pay

## 2018-11-08 MED ORDER — HYDRALAZINE HCL 50 MG PO TABS: 50.0000 mg | ORAL_TABLET | Freq: Three times a day (TID) | ORAL | 0 refills | Status: DC

## 2018-11-08 NOTE — Telephone Encounter (Signed)
Sent to pharmacy 

## 2018-11-09 DIAGNOSIS — Z20828 Contact with and (suspected) exposure to other viral communicable diseases: Secondary | ICD-10-CM | POA: Diagnosis not present

## 2018-11-09 DIAGNOSIS — R0602 Shortness of breath: Secondary | ICD-10-CM | POA: Diagnosis not present

## 2018-11-09 DIAGNOSIS — E1129 Type 2 diabetes mellitus with other diabetic kidney complication: Secondary | ICD-10-CM | POA: Diagnosis not present

## 2018-11-09 DIAGNOSIS — Q613 Polycystic kidney, unspecified: Secondary | ICD-10-CM | POA: Diagnosis not present

## 2018-11-09 DIAGNOSIS — N189 Chronic kidney disease, unspecified: Secondary | ICD-10-CM | POA: Diagnosis not present

## 2018-11-09 DIAGNOSIS — E785 Hyperlipidemia, unspecified: Secondary | ICD-10-CM | POA: Diagnosis not present

## 2018-11-09 DIAGNOSIS — I2510 Atherosclerotic heart disease of native coronary artery without angina pectoris: Secondary | ICD-10-CM | POA: Diagnosis not present

## 2018-11-09 DIAGNOSIS — R079 Chest pain, unspecified: Secondary | ICD-10-CM | POA: Diagnosis not present

## 2018-11-09 DIAGNOSIS — Z79899 Other long term (current) drug therapy: Secondary | ICD-10-CM | POA: Diagnosis not present

## 2018-11-09 DIAGNOSIS — Z7982 Long term (current) use of aspirin: Secondary | ICD-10-CM | POA: Diagnosis not present

## 2018-11-09 DIAGNOSIS — I129 Hypertensive chronic kidney disease with stage 1 through stage 4 chronic kidney disease, or unspecified chronic kidney disease: Secondary | ICD-10-CM | POA: Diagnosis not present

## 2018-11-09 DIAGNOSIS — E669 Obesity, unspecified: Secondary | ICD-10-CM | POA: Diagnosis not present

## 2018-11-09 DIAGNOSIS — R42 Dizziness and giddiness: Secondary | ICD-10-CM | POA: Diagnosis not present

## 2018-11-09 DIAGNOSIS — N183 Chronic kidney disease, stage 3 (moderate): Secondary | ICD-10-CM | POA: Diagnosis not present

## 2018-11-09 DIAGNOSIS — I10 Essential (primary) hypertension: Secondary | ICD-10-CM | POA: Diagnosis not present

## 2018-11-09 DIAGNOSIS — Z6828 Body mass index (BMI) 28.0-28.9, adult: Secondary | ICD-10-CM | POA: Diagnosis not present

## 2018-11-09 DIAGNOSIS — R202 Paresthesia of skin: Secondary | ICD-10-CM | POA: Diagnosis not present

## 2018-11-10 DIAGNOSIS — Z20828 Contact with and (suspected) exposure to other viral communicable diseases: Secondary | ICD-10-CM | POA: Diagnosis not present

## 2018-11-10 DIAGNOSIS — I2510 Atherosclerotic heart disease of native coronary artery without angina pectoris: Secondary | ICD-10-CM | POA: Diagnosis not present

## 2018-11-10 DIAGNOSIS — Z6828 Body mass index (BMI) 28.0-28.9, adult: Secondary | ICD-10-CM | POA: Diagnosis not present

## 2018-11-10 DIAGNOSIS — E669 Obesity, unspecified: Secondary | ICD-10-CM | POA: Diagnosis not present

## 2018-11-10 DIAGNOSIS — N189 Chronic kidney disease, unspecified: Secondary | ICD-10-CM | POA: Diagnosis not present

## 2018-11-10 DIAGNOSIS — R0602 Shortness of breath: Secondary | ICD-10-CM | POA: Diagnosis not present

## 2018-11-10 DIAGNOSIS — R079 Chest pain, unspecified: Secondary | ICD-10-CM | POA: Diagnosis not present

## 2018-11-10 DIAGNOSIS — Q613 Polycystic kidney, unspecified: Secondary | ICD-10-CM | POA: Diagnosis not present

## 2018-11-10 DIAGNOSIS — I129 Hypertensive chronic kidney disease with stage 1 through stage 4 chronic kidney disease, or unspecified chronic kidney disease: Secondary | ICD-10-CM | POA: Diagnosis not present

## 2018-11-15 DIAGNOSIS — R5383 Other fatigue: Secondary | ICD-10-CM | POA: Diagnosis not present

## 2018-11-15 DIAGNOSIS — D649 Anemia, unspecified: Secondary | ICD-10-CM | POA: Diagnosis not present

## 2018-11-15 DIAGNOSIS — D6489 Other specified anemias: Secondary | ICD-10-CM | POA: Diagnosis not present

## 2018-11-15 DIAGNOSIS — Q613 Polycystic kidney, unspecified: Secondary | ICD-10-CM | POA: Diagnosis not present

## 2018-11-15 DIAGNOSIS — N183 Chronic kidney disease, stage 3 (moderate): Secondary | ICD-10-CM | POA: Diagnosis not present

## 2018-11-15 DIAGNOSIS — R7989 Other specified abnormal findings of blood chemistry: Secondary | ICD-10-CM | POA: Diagnosis not present

## 2018-11-15 DIAGNOSIS — I10 Essential (primary) hypertension: Secondary | ICD-10-CM | POA: Diagnosis not present

## 2018-11-16 DIAGNOSIS — N182 Chronic kidney disease, stage 2 (mild): Secondary | ICD-10-CM | POA: Diagnosis not present

## 2018-11-16 DIAGNOSIS — I129 Hypertensive chronic kidney disease with stage 1 through stage 4 chronic kidney disease, or unspecified chronic kidney disease: Secondary | ICD-10-CM | POA: Diagnosis not present

## 2018-11-16 DIAGNOSIS — Q612 Polycystic kidney, adult type: Secondary | ICD-10-CM | POA: Diagnosis not present

## 2018-11-22 DIAGNOSIS — I129 Hypertensive chronic kidney disease with stage 1 through stage 4 chronic kidney disease, or unspecified chronic kidney disease: Secondary | ICD-10-CM | POA: Diagnosis not present

## 2018-11-22 DIAGNOSIS — N183 Chronic kidney disease, stage 3 (moderate): Secondary | ICD-10-CM | POA: Diagnosis not present

## 2018-11-29 DIAGNOSIS — I129 Hypertensive chronic kidney disease with stage 1 through stage 4 chronic kidney disease, or unspecified chronic kidney disease: Secondary | ICD-10-CM | POA: Diagnosis not present

## 2018-11-29 DIAGNOSIS — N183 Chronic kidney disease, stage 3 (moderate): Secondary | ICD-10-CM | POA: Diagnosis not present

## 2018-11-29 DIAGNOSIS — R030 Elevated blood-pressure reading, without diagnosis of hypertension: Secondary | ICD-10-CM | POA: Diagnosis not present

## 2018-12-04 ENCOUNTER — Other Ambulatory Visit: Payer: Self-pay

## 2018-12-04 DIAGNOSIS — I10 Essential (primary) hypertension: Secondary | ICD-10-CM

## 2018-12-04 MED ORDER — HYDRALAZINE HCL 50 MG PO TABS: 50.0000 mg | ORAL_TABLET | Freq: Three times a day (TID) | ORAL | 0 refills | Status: DC

## 2018-12-05 DIAGNOSIS — R030 Elevated blood-pressure reading, without diagnosis of hypertension: Secondary | ICD-10-CM | POA: Diagnosis not present

## 2018-12-10 DIAGNOSIS — I129 Hypertensive chronic kidney disease with stage 1 through stage 4 chronic kidney disease, or unspecified chronic kidney disease: Secondary | ICD-10-CM | POA: Diagnosis not present

## 2018-12-10 DIAGNOSIS — R0683 Snoring: Secondary | ICD-10-CM | POA: Diagnosis not present

## 2018-12-10 DIAGNOSIS — N183 Chronic kidney disease, stage 3 (moderate): Secondary | ICD-10-CM | POA: Diagnosis not present

## 2018-12-11 ENCOUNTER — Other Ambulatory Visit: Payer: Self-pay

## 2018-12-11 ENCOUNTER — Ambulatory Visit (INDEPENDENT_AMBULATORY_CARE_PROVIDER_SITE_OTHER): Payer: Medicare Other | Admitting: Cardiology

## 2018-12-11 ENCOUNTER — Encounter: Payer: Self-pay | Admitting: Cardiology

## 2018-12-11 VITALS — BP 137/81 | HR 63 | Temp 99.6°F | Ht 71.0 in | Wt 209.0 lb

## 2018-12-11 DIAGNOSIS — I10 Essential (primary) hypertension: Secondary | ICD-10-CM

## 2018-12-11 DIAGNOSIS — R002 Palpitations: Secondary | ICD-10-CM | POA: Diagnosis not present

## 2018-12-11 MED ORDER — ATENOLOL 50 MG PO TABS: 50.0000 mg | ORAL_TABLET | Freq: Every evening | ORAL | 2 refills | Status: DC

## 2018-12-11 NOTE — Progress Notes (Signed)
Primary Physician/Referring:  Marton Redwood, MD  Patient ID: Joseph Nguyen, male    DOB: 05/11/1951, 68 y.o.   MRN: [id number]  Chief Complaint  Patient presents with  . Palpitations    6 week f/u  . Hypertension    HPI: Joseph Nguyen  is a 68 y.o. male  with hypertension, hyperlipidemia, controlled diabetes, who I had seen about a month ago, had been doing well but recently has noticed uncontrolled hypertension.  He also has h/o asymptomatic multiple cysts in the kidney without adverse effects.   Due to recent uncontrolled hypertension after he had seen me 6 weeks ago and added hydralazine.  He made a trip to [place] and blood pressure was high and took hydralazine felt extremely weak and tired tingling in his hand and chest pain and presented to [facility] where he underwent stress testing, ultrasound abdomen and labs and was discharged home on Benicar HCT only and advised not to take hydralazine.  He still continues to notice elevated blood pressure around 140 mmHg and he is extremely concerned about this as his blood pressure is usually 209 mmHg systolic.  No chest pain no dizziness or syncope. He also complains of occasional skipped beats and palpitations.  Past Medical History:  Diagnosis Date  . Controlled type 2 diabetes mellitus without complication ([place]) 4/70/9628  . DM (diabetes mellitus) ([place])   . Essential hypertension 07/27/2011  . Hiatal hernia   . Hypercholesterolemia   . Kidney disease    Polycystic,   . Mixed hyperlipidemia 11/05/2018  . Obesity   . Polycystic kidney disease 11/05/2018    Past Surgical History:  Procedure Laterality Date  . CARDIOVASCULAR STRESS TEST  10-18-2005   EF 79%  . TUMOR REMOVAL  1968   Left Breast Benign   Social History   Socioeconomic History  . Marital status: Married    Spouse name: Not on file  . Number of children: Not on file  . Years of education: Not on file  . Highest education level: Not on file   Occupational History  . Not on file  Social Needs  . Financial resource strain: Not on file  . Food insecurity    Worry: Not on file    Inability: Not on file  . Transportation needs    Medical: Not on file    Non-medical: Not on file  Tobacco Use  . Smoking status: Former Research scientist (life sciences)  . Smokeless tobacco: Never Used  . Tobacco comment: Quite 68yr+ ago  Substance and Sexual Activity  . Alcohol use: Not on file  . Drug use: Not on file  . Sexual activity: Not on file  Lifestyle  . Physical activity    Days per week: Not on file    Minutes per session: Not on file  . Stress: Not on file  Relationships  . Social [place] phone: Not on file    Gets together: Not on file    Attends religious service: Not on file    Active member of club or organization: Not on file    Attends meetings of clubs or organizations: Not on file    Relationship status: Not on file  . Intimate partner violence    Fear of Nguyen or ex partner: Not on file    Emotionally abused: Not on file    Physically abused: Not on file    Forced sexual activity: Not on file  Other Topics Concern  .  Not on file  Social History Narrative  . Not on file   Review of Systems  Constitution: Negative for chills, decreased appetite, malaise/fatigue and weight gain.  Cardiovascular: Positive for palpitations. Negative for chest pain, leg swelling and syncope.  Respiratory: Positive for snoring (occasional). Negative for sleep disturbances due to breathing and wheezing.   Endocrine: Negative for cold intolerance.  Hematologic/Lymphatic: Does not bruise/bleed easily.  Musculoskeletal: Negative for joint swelling.  Gastrointestinal: Negative for abdominal pain, anorexia, change in bowel habit, hematochezia and melena.  Neurological: Negative for headaches and light-headedness.  Psychiatric/Behavioral: Negative for depression and substance abuse.  All other systems reviewed and are negative.     Objective   Blood pressure 137/81, pulse 63, temperature 99.6 F (37.6 C), height 5' 11"  (1.803 m), weight 209 lb (94.8 kg), SpO2 96 %. Body mass index is 29.15 kg/m.     Physical Exam  Constitutional: He appears well-developed and well-nourished. No distress.  HENT:  Head: Atraumatic.  Eyes: Conjunctivae are normal.  Neck: Neck supple. No JVD present. No thyromegaly present.  Cardiovascular: Normal rate, regular rhythm, normal heart sounds and intact distal pulses. Exam reveals no gallop.  No murmur heard. Pulmonary/Chest: Effort normal and breath sounds normal.  Abdominal: Soft. Bowel sounds are normal.  Musculoskeletal: Normal range of motion.  Neurological: He is alert.  Skin: Skin is warm and dry.  Psychiatric: He has a normal mood and affect.   Radiology: No results found.  Laboratory examination:   Labs 11/09/2018: CBC normal, serum glucose 166 minute grams, serum creatinine 1.24, eGFR 60 mL.  04/07/2017: Serum glucose 165 mg, BUN 23, creatinine 1.2, eGFR 60 ML, potassium 4.2. CBC normal.  Total cholesterol 138, triglycerides 145, HDL 32, LDL 77. Non-HDL cholesterol 106. Apo B normal at 82, TSH normal, PSA normal.    Medications   There are no discontinued medications. Nguyen Meds  Medication Sig  . aspirin 325 MG tablet Take 325 mg by mouth daily.  . cholecalciferol (VITAMIN D3) 25 MCG (1000 UT) tablet Take 1,000 Units by mouth daily.  . Coenzyme Q10 (COQ10) 100 MG CAPS Take by mouth.  . Ferrous Sulfate (IRON) 142 (45 Fe) MG TBCR Take 1 tablet by mouth daily.  . metFORMIN (GLUCOPHAGE) 1000 MG tablet Take 1,000 mg by mouth 2 (two) times daily with a meal.   . multivitamin-lutein (OCUVITE-LUTEIN) CAPS capsule Take 1 capsule by mouth daily.  . NON FORMULARY Take 1 tablet by mouth daily. cinsulin  . olmesartan-hydrochlorothiazide (BENICAR HCT) 40-12.5 MG tablet Take 1 tablet by mouth daily.  . Omega-3 Fatty Acids (FISH OIL) 1000 MG CAPS Take by mouth.  Marland Kitchen omeprazole (PRILOSEC) 40  MG capsule Take 40 mg by mouth daily.  . Potassium 99 MG TABS Take by mouth.  . rosuvastatin (CRESTOR) 10 MG tablet take 1 tablet by mouth once daily  . vitamin B-12 (CYANOCOBALAMIN) 1000 MCG tablet Take 1,000 mcg by mouth daily.  . vitamin C (ASCORBIC ACID) 500 MG tablet Take 500 mg by mouth daily.  . vitamin E 100 UNIT capsule Take by mouth daily.    Cardiac Studies:   Lexiscan Myoview stress test 11/10/2018 [facility]):  No evidence of ischemia, normal LVEF.  Low risk study.  Chest x-ray PA and lateral view normal.  Ultrasound of the abdomen revealed bilateral mild cystic disease of the kidney, findings suggestive of polycystic renal disease on cystic renal disease in the setting of renal failure.  No definite hydronephrosis bilaterally.  Assessment  ICD-10-CM   1. Palpitations  R00.2 EKG 12-Lead    atenolol (TENORMIN) 50 MG tablet  2. Essential hypertension  I10 EKG 12-Lead    atenolol (TENORMIN) 50 MG tablet    EKG 12/11/2018: Normal sinus rhythm at the rate of 68 bpm, normal axis, incomplete right bundle branch block.  Normal EKG. No significant change from  EKG 11/01/2017  Recommendations:   Please see history of present illness with regard to his visit to [facility] for elevated blood pressure and atypical chest pain, nuclear stress test dated for ischemia, blood pressure is now much improved and he brings his blood pressure cuff which records 10 mmHg higher pressure.  Patient extremely concerned about blood pressure, I have added atenolol 50 mg daily both for palpitations and hypertension.  I reviewed his labs and also test performed at the [facility].  I reassured him and over the telephone his wife as well and I would like to see him back in 6 to 8 weeks  weeks and if he is tolerating the medication well and blood pressure is well controlled, I'll see him back on a p.r.n. basis.  Joseph Prows, MD, [facility] 12/11/2018, 3:29 PM [facility] [place] Pager: [phone] Office: [phone] If no answer Cell [phone]

## 2018-12-17 ENCOUNTER — Ambulatory Visit (INDEPENDENT_AMBULATORY_CARE_PROVIDER_SITE_OTHER): Payer: Medicare Other | Admitting: Neurology

## 2018-12-17 ENCOUNTER — Encounter: Payer: Self-pay | Admitting: Neurology

## 2018-12-17 ENCOUNTER — Other Ambulatory Visit: Payer: Self-pay

## 2018-12-17 VITALS — BP 128/78 | HR 52 | Ht 71.0 in | Wt 209.0 lb

## 2018-12-17 DIAGNOSIS — R519 Headache, unspecified: Secondary | ICD-10-CM

## 2018-12-17 DIAGNOSIS — Z87898 Personal history of other specified conditions: Secondary | ICD-10-CM | POA: Diagnosis not present

## 2018-12-17 DIAGNOSIS — R001 Bradycardia, unspecified: Secondary | ICD-10-CM | POA: Diagnosis not present

## 2018-12-17 DIAGNOSIS — R031 Nonspecific low blood-pressure reading: Secondary | ICD-10-CM | POA: Diagnosis not present

## 2018-12-17 DIAGNOSIS — R51 Headache: Secondary | ICD-10-CM | POA: Diagnosis not present

## 2018-12-17 DIAGNOSIS — R0683 Snoring: Secondary | ICD-10-CM

## 2018-12-17 DIAGNOSIS — R351 Nocturia: Secondary | ICD-10-CM

## 2018-12-17 DIAGNOSIS — E663 Overweight: Secondary | ICD-10-CM | POA: Diagnosis not present

## 2018-12-17 NOTE — Patient Instructions (Signed)

## 2018-12-17 NOTE — Progress Notes (Signed)
Subjective:    Patient ID: Joseph Nguyen is a 68 y.o. male.  HPI     Star Age, MD, PhD [facility] [address] [place], [place] [address]  Dear Dr. Brigitte Pulse, I saw your patient, Ardith Lewman, upon your kind request in my sleep clinic today for initial consultation of his sleep disorder, in particular, concern for underlying obstructive sleep apnea.  The patient is unaccompanied today.  As you know, Mr. Hoar is a 68 year old right-handed gentleman with an underlying history of hypertension, with recent concern for overnight hypotension, polycystic kidney disease, Hx of chest pain with neg w/u at [facility] some 2 months ago, hyperlipidemia, history of esophagitis, diabetes and overweight state, who reports snoring.  I reviewed your office records from 12/10/2018 which was a telemedicine visit.  I also reviewed his overnight blood pressure record from 11/29/2018 through 11/30/2018 which did show lower blood pressure values between midnight and 7 AM.  He had values into the higher 70s over 40s even.  He did not have symptoms at night.  He had been followed by cardiology, recently started seeing Dr. Einar Gip.  He had a thallium stress test when he was visiting his daughter in [place] a couple of months ago and started having chest pain.  Work-up was negative by his report.  He has had some adjustment in his blood pressure medication was recently started by Dr. Einar Gip on atenolol.  He is retired as a Engineer, maintenance (IT).  He works for [employer].  He quit smoking over 40 years ago.  He drinks alcohol occasionally.  He drinks no day-to-day caffeine as per cardiology recommendation.  He snores, especially when he sleeps on his back per wife's report and he is noted to him breathe through his mouth.  He has nocturia once in the early morning, bedtime is generally around 11 PM, rise time between 6 and 8.  He does not watch TV in the bedroom typically.  He has no family history  of sleep apnea.  They have 3 grown children, 2 daughters and 1 son.  His Past Medical History Is Significant For: Past Medical History:  Diagnosis Date  . Controlled type 2 diabetes mellitus without complication ([place]) 0/63/0160  . DM (diabetes mellitus) ([place])   . Essential hypertension 07/27/2011  . Hiatal hernia   . Hypercholesterolemia   . Kidney disease    Polycystic,   . Mixed hyperlipidemia 11/05/2018  . Obesity   . Polycystic kidney disease 11/05/2018    His Past Surgical History Is Significant For: Past Surgical History:  Procedure Laterality Date  . CARDIOVASCULAR STRESS TEST  10-18-2005   EF 79%  . TUMOR REMOVAL  1968   Left Breast Benign    His Family History Is Significant For: Family History  Problem Relation Age of Onset  . Cancer Mother     His Social History Is Significant For: Social History   Socioeconomic History  . Marital status: Married    Spouse name: Not on file  . Number of children: Not on file  . Years of education: Not on file  . Highest education level: Not on file  Occupational History  . Not on file  Social Needs  . Financial resource strain: Not on file  . Food insecurity    Worry: Not on file    Inability: Not on file  . Transportation needs    Medical: Not on file    Non-medical: Not on file  Tobacco  Use  . Smoking status: Former Smoker  . Smokeless tobacco: Never Used  . Tobacco comment: Quite 4yrs+ ago  Substance and Sexual Activity  . Alcohol use: Not on file  . Drug use: Not on file  . Sexual activity: Not on file  Lifestyle  . Physical activity    Days per week: Not on file    Minutes per session: Not on file  . Stress: Not on file  Relationships  . Social Herbalist on phone: Not on file    Gets together: Not on file    Attends religious service: Not on file    Active member of club or organization: Not on file    Attends meetings of clubs or organizations: Not on file    Relationship status: Not on  file  Other Topics Concern  . Not on file  Social History Narrative  . Not on file    His Allergies Are:  Allergies  Allergen Reactions  . Zocor [Simvastatin - High Dose] Other (See Comments)    myalgias  :   His Current Medications Are:  Outpatient Encounter Medications as of 12/17/2018  Medication Sig  . aspirin 325 MG tablet Take 325 mg by mouth daily.  Marland Kitchen atenolol (TENORMIN) 50 MG tablet Take 1 tablet (50 mg total) by mouth every evening.  Marland Kitchen BIOGAIA PROBIOTIC (BIOGAIA PROBIOTIC) LIQD Take by mouth daily at 8 pm.  . cholecalciferol (VITAMIN D3) 25 MCG (1000 UT) tablet Take 1,000 Units by mouth daily.  . Coenzyme Q10 (COQ10) 100 MG CAPS Take by mouth.  . Ferrous Sulfate (IRON) 142 (45 Fe) MG TBCR Take 1 tablet by mouth daily.  . metFORMIN (GLUCOPHAGE) 1000 MG tablet Take 1,000 mg by mouth 2 (two) times daily with a meal.   . multivitamin-lutein (OCUVITE-LUTEIN) CAPS capsule Take 1 capsule by mouth daily.  . NON FORMULARY Take 1 tablet by mouth daily. cinsulin  . olmesartan-hydrochlorothiazide (BENICAR HCT) 40-12.5 MG tablet Take 1 tablet by mouth daily.  . Omega-3 Fatty Acids (FISH OIL) 1000 MG CAPS Take by mouth.  Marland Kitchen omeprazole (PRILOSEC) 40 MG capsule Take 40 mg by mouth daily.  . Potassium 99 MG TABS Take by mouth.  . rosuvastatin (CRESTOR) 10 MG tablet take 1 tablet by mouth once daily  . vitamin B-12 (CYANOCOBALAMIN) 1000 MCG tablet Take 1,000 mcg by mouth daily.  . vitamin C (ASCORBIC ACID) 500 MG tablet Take 500 mg by mouth daily.  . vitamin E 100 UNIT capsule Take by mouth daily.  . [DISCONTINUED] amLODipine (NORVASC) 10 MG tablet Take 1 tablet (10 mg total) by mouth daily after supper. Take one additional tablet if SBP > 150 mm Hg (Patient not taking: Reported on 12/11/2018)  . [DISCONTINUED] hydrALAZINE (APRESOLINE) 50 MG tablet Take 1 tablet (50 mg total) by mouth 3 (three) times daily. (Patient not taking: Reported on 12/11/2018)   No facility-administered encounter  medications on file as of 12/17/2018.   :  Review of Systems:  Out of a complete 14 point review of systems, all are reviewed and negative with the exception of these symptoms as listed below: Review of Systems  Neurological:       Pt presents today to discuss his sleep. Pt reports that he had a sleep study about 15 years ago but it was negative for OSA. Pt does endorse snoring.  Epworth Sleepiness Scale 0= would never doze 1= slight chance of dozing 2= moderate chance of dozing 3= high chance of  dozing  Sitting and reading: 1 Watching TV: 2 Sitting inactive in a public place (ex. Theater or meeting): 0 As a passenger in a car for an hour without a break: 1 Lying down to rest in the afternoon: 2 Sitting and talking to someone: 0 Sitting quietly after lunch (no alcohol): 0 In a car, while stopped in traffic: 0 Total: 6     Objective:  Neurological Exam  Physical Exam Physical Examination:   Vitals:   12/17/18 1524  BP: 128/78  Pulse: (!) 52   General Examination: The patient is a very pleasant 68 y.o. male in no acute distress. He appears well-developed and well-nourished and well groomed.   HEENT: Normocephalic, atraumatic, pupils are equal, round and reactive to light and accommodation. Corrective eyeglasses in place. Extraocular tracking is good without limitation to gaze excursion or nystagmus noted. Normal smooth pursuit is noted. Hearing is grossly intact. Face is symmetric with normal facial animation and normal facial sensation. Speech is clear with no dysarthria noted. There is no hypophonia. There is no lip, neck/head, jaw or voice tremor. Neck is supple with full range of passive and active motion. There are no carotid bruits on auscultation. Oropharynx exam reveals: moderate mouth dryness, adequate dental hygiene and mild airway crowding, due to Small airway entry, tonsils are small, Mallampati class II, tongue protrudes centrally in palate elevates symmetrically.   Neck circumference is 16-7/8 inches.  He has a minimal overbite  Chest: Clear to auscultation without wheezing, rhonchi or crackles noted.  Heart: S1+S2+0, regular and normal without murmurs, rubs or gallops noted.   Abdomen: Soft, non-tender and non-distended with normal bowel sounds appreciated on auscultation.  Extremities: There is no pitting edema in the distal lower extremities bilaterally. Pedal pulses are intact.  Skin: Warm and dry without trophic changes noted. There are no varicose veins.  Musculoskeletal: exam reveals no obvious joint deformities, tenderness or joint swelling or erythema.   Neurologically:  Mental status: The patient is awake, alert and oriented in all 4 spheres. His immediate and remote memory, attention, language skills and fund of knowledge are appropriate. There is no evidence of aphasia, agnosia, apraxia or anomia. Speech is clear with normal prosody and enunciation. Thought process is linear. Mood is normal and affect is normal.  Cranial nerves II - XII are as described above under HEENT exam. In addition: shoulder shrug is normal with equal shoulder height noted. Motor exam: Normal bulk, strength and tone is noted. There is no drift, tremor or rebound. Romberg is negative. coordination: intact in the UEs.  Cerebellar testing: No dysmetria or intention tremor. There is no truncal or gait ataxia.  Sensory exam: intact to light touch.  Gait, station and balance: He stands easily. No veering to one side is noted. No leaning to one side is noted. Posture is age-appropriate and stance is narrow based. Gait shows normal stride length and normal pace. No problems turning are noted. Tandem walk is unremarkable.   Assessment and Plan:   In summary, Izaiha A Lundberg is a very pleasant 68 y.o.-year old male with an underlying history of hypertension, with recent concern for overnight hypotension, polycystic kidney disease, Hx of chest pain with neg w/u at [facility]  some 2 months ago, hyperlipidemia, history of esophagitis, diabetes and overweight state, who Presents for evaluation of his sleep disorder, in particular concern for underlying obstructive sleep apnea, in the context of recent abnormal blood pressure numbers overnight with quite a few low numbers and history  of blood pressure fluctuation in the recent past, chest pain in the recent past. His history and physical exam are concerning for obstructive sleep apnea (OSA). I had a long chat with the patient about my findings and the diagnosis of OSA, its prognosis and treatment options. We talked about medical treatments, surgical interventions and non-pharmacological approaches. I explained in particular the risks and ramifications of untreated moderate to severe OSA, especially with respect to developing cardiovascular disease down the Road, including congestive heart failure, difficult to treat hypertension, cardiac arrhythmias, or stroke. Even type 2 diabetes has, in part, been linked to untreated OSA. Symptoms of untreated OSA include daytime sleepiness, memory problems, mood irritability and mood disorder such as depression and anxiety, lack of energy, as well as recurrent headaches, especially morning headaches. We talked about trying to maintain a healthy lifestyle in general, as well as the importance of weight control. I encouraged the patient to eat healthy, exercise daily and keep well hydrated, to keep a scheduled bedtime and wake time routine, to not skip any meals and eat healthy snacks in between meals. I advised the patient not to drive when feeling sleepy. I recommended the following at this time: sleep study.   I explained the sleep test procedure to the patient and also outlined possible surgical and non-surgical treatment options of OSA, including the use of a custom-made dental device (which would require a referral to a specialist dentist or oral surgeon), upper airway surgical options, such as  pillar implants, radiofrequency surgery, tongue base surgery, and UPPP (which would involve a referral to an ENT surgeon). Rarely, jaw surgery such as mandibular advancement may be considered.  I also explained the CPAP treatment option to the patient, who indicated that he would be willing to try CPAP if the need arises. I explained the importance of being compliant with PAP treatment, not only for insurance purposes but primarily to improve His symptoms, and for the patient's long term health benefit, including to reduce His cardiovascular risks. I answered all his questions today and the patient was in agreement. I plan to see him back after the sleep study is completed and encouraged him to call with any interim questions, concerns, problems or updates.   Thank you very much for allowing me to participate in the care of this nice patient. If I can be of any further assistance to you please do not hesitate to call me at [phone].  Sincerely,   Star Age, MD, PhD

## 2018-12-21 ENCOUNTER — Ambulatory Visit: Payer: Medicare Other | Admitting: Cardiology

## 2018-12-27 DIAGNOSIS — D649 Anemia, unspecified: Secondary | ICD-10-CM | POA: Diagnosis not present

## 2018-12-27 DIAGNOSIS — N183 Chronic kidney disease, stage 3 (moderate): Secondary | ICD-10-CM | POA: Diagnosis not present

## 2018-12-27 DIAGNOSIS — E7849 Other hyperlipidemia: Secondary | ICD-10-CM | POA: Diagnosis not present

## 2018-12-27 DIAGNOSIS — E669 Obesity, unspecified: Secondary | ICD-10-CM | POA: Diagnosis not present

## 2018-12-27 DIAGNOSIS — E1129 Type 2 diabetes mellitus with other diabetic kidney complication: Secondary | ICD-10-CM | POA: Diagnosis not present

## 2018-12-27 DIAGNOSIS — Q613 Polycystic kidney, unspecified: Secondary | ICD-10-CM | POA: Diagnosis not present

## 2018-12-27 DIAGNOSIS — I129 Hypertensive chronic kidney disease with stage 1 through stage 4 chronic kidney disease, or unspecified chronic kidney disease: Secondary | ICD-10-CM | POA: Diagnosis not present

## 2018-12-27 DIAGNOSIS — R5383 Other fatigue: Secondary | ICD-10-CM | POA: Diagnosis not present

## 2019-01-02 ENCOUNTER — Other Ambulatory Visit: Payer: Self-pay | Admitting: Cardiology

## 2019-01-02 DIAGNOSIS — R002 Palpitations: Secondary | ICD-10-CM

## 2019-01-02 DIAGNOSIS — I10 Essential (primary) hypertension: Secondary | ICD-10-CM

## 2019-01-03 NOTE — Telephone Encounter (Signed)
Please fill

## 2019-01-04 NOTE — Telephone Encounter (Signed)
Dont send me these messages please, take care of it, medication is in the chart

## 2019-01-08 ENCOUNTER — Ambulatory Visit (INDEPENDENT_AMBULATORY_CARE_PROVIDER_SITE_OTHER): Payer: Medicare Other | Admitting: Neurology

## 2019-01-08 ENCOUNTER — Other Ambulatory Visit: Payer: Self-pay

## 2019-01-08 DIAGNOSIS — R031 Nonspecific low blood-pressure reading: Secondary | ICD-10-CM

## 2019-01-08 DIAGNOSIS — R519 Headache, unspecified: Secondary | ICD-10-CM

## 2019-01-08 DIAGNOSIS — E663 Overweight: Secondary | ICD-10-CM

## 2019-01-08 DIAGNOSIS — R0683 Snoring: Secondary | ICD-10-CM

## 2019-01-08 DIAGNOSIS — R351 Nocturia: Secondary | ICD-10-CM

## 2019-01-08 DIAGNOSIS — G4730 Sleep apnea, unspecified: Secondary | ICD-10-CM

## 2019-01-08 DIAGNOSIS — Z87898 Personal history of other specified conditions: Secondary | ICD-10-CM

## 2019-01-08 DIAGNOSIS — G4720 Circadian rhythm sleep disorder, unspecified type: Secondary | ICD-10-CM

## 2019-01-08 DIAGNOSIS — R001 Bradycardia, unspecified: Secondary | ICD-10-CM

## 2019-01-08 DIAGNOSIS — G4733 Obstructive sleep apnea (adult) (pediatric): Secondary | ICD-10-CM

## 2019-01-15 ENCOUNTER — Telehealth: Payer: Self-pay

## 2019-01-15 NOTE — Telephone Encounter (Signed)
I called pt, discussed his sleep study results. Pt verbalized understanding of results and recommendations. Pt will follow up Dr. Brigitte Pulse.

## 2019-01-15 NOTE — Telephone Encounter (Signed)
-----   Message from Star Age, MD sent at 01/15/2019  8:29 AM EDT ----- Patient referred by Dr. Brigitte Pulse, seen by me on 12/17/18, diagnostic PSG on 01/08/19.   Please call and notify the patient that the recent sleep study did not show any significant obstructive sleep apnea with the exception of mild, supine REM sleep related OSA. Snoring was noted, mostly mild and intermittent. Treatment with positive airway pressure is not warranted; weight loss and avoiding the supine sleep position is recommended.  He can FU with Dr. Brigitte Pulse at this point.  Thanks,  Star Age, MD, PhD [facility])

## 2019-01-15 NOTE — Progress Notes (Signed)
Patient referred by Dr. Brigitte Pulse, seen by me on 12/17/18, diagnostic PSG on 01/08/19.   Please call and notify the patient that the recent sleep study did not show any significant obstructive sleep apnea with the exception of mild, supine REM sleep related OSA. Snoring was noted, mostly mild and intermittent. Treatment with positive airway pressure is not warranted; weight loss and avoiding the supine sleep position is recommended.  He can FU with Dr. Brigitte Pulse at this point.  Thanks,  Star Age, MD, PhD [facility])

## 2019-01-15 NOTE — Procedures (Signed)
PATIENT'S NAME:  Joseph Nguyen, Joseph Nguyen DOB:      1950/07/14      MRN:    [id number]     DATE OF RECORDING: 01/08/2019 REFERRING M.D.:  Assunta Curtis, MD Study Performed:   Baseline Polysomnogram HISTORY: 68 year old man with a history of hypertension, with recent concern for overnight hypotension, polycystic kidney disease, Hx of chest pain with neg w/u at [facility], hyperlipidemia, history of esophagitis, diabetes and overweight state, who reports snoring and mouth breathing. The patient endorsed the Epworth Sleepiness Scale at 6/24 points. The patient's weight 209 pounds with a height of 71 (inches), resulting in a BMI of 29.3 kg/m2. The patient's neck circumference measured 16.8 inches.  CURRENT MEDICATIONS: Tenormin, Aspirin, Probiotics, Vitamin D3, Coenzyme Q10, Ferrous sulfate, Glucophage, Multivitamin, Benicar, Omega-3, Prilosec, Postassium, Crestor, Vitamin B12, Vitamin C.   PROCEDURE:  This is a multichannel digital polysomnogram utilizing the Somnostar 11.2 system.  Electrodes and sensors were applied and monitored per AASM Specifications.   EEG, EOG, Chin and Limb EMG, were sampled at 200 Hz.  ECG, Snore and Nasal Pressure, Thermal Airflow, Respiratory Effort, CPAP Flow and Pressure, Oximetry was sampled at 50 Hz. Digital video and audio were recorded.      BASELINE STUDY: Lights Out was at 21:59 and Lights On at 05:05.  Total recording time (TRT) was 426.5 minutes, with a total sleep time (TST) of 364.5 minutes.   The patient's sleep latency was 22.5 minutes.  REM latency was 155 minutes, which is delayed. The sleep efficiency was 85.5%.     SLEEP ARCHITECTURE: WASO (Wake after sleep onset) was 51.5 minutes with mild sleep fragmentation noted and one longer period of wakefulness towards the end of the study. There were 3.5 minutes in Stage N1, 265.5 minutes Stage N2, 30.5 minutes Stage N3 and 65 minutes in Stage REM.  The percentage of Stage N1 was 1.%, Stage N2 was 72.8%, which is markedly  increased, Stage N3 was 8.4% and Stage R (REM sleep) was 17.8%, which is mildly reduced.  RESPIRATORY ANALYSIS:  There were a total of 19 respiratory events:  6 obstructive apneas, 2 central apneas and 0 mixed apneas with a total of 8 apneas and an apnea index (AI) of 1.3 /hour. There were 11 hypopneas with a hypopnea index of 1.8 /hour. The patient also had 0 respiratory event related arousals (RERAs).      The total APNEA/HYPOPNEA INDEX (AHI) was 3.1 /hour and the total RESPIRATORY DISTURBANCE INDEX was 3.1 /hour.  10 events occurred in REM sleep and 13 events in NREM. The REM AHI was 9.2 /hour, versus a non-REM AHI of 1.8. The patient spent 227 minutes of total sleep time in the supine position and 138 minutes in non-supine.. The supine AHI was 4.8/h versus a non-supine AHI of 0.4/h.  OXYGEN SATURATION & C02:  The Wake baseline 02 saturation was 92%, with the lowest being 85%. Time spent below 89% saturation equaled 28 minutes.  AROUSALS:  The arousals were noted as: 55 were spontaneous, 0 were associated with PLMs, 3 were associated with respiratory events. The patient had a total of 0 Periodic Limb Movements.  The Periodic Limb Movement (PLM) index was 0 and the PLM Arousal index was 0/hour.  Audio and video analysis did not show any abnormal or unusual movements, complex behaviors, phonations or vocalizations. No nocturia. Mild intermittent snoring was noted. The EKG in normal sinus rhythm (NSR).  IMPRESSION:  1. Sleep disordered breathing 2. Dysfunctions associated with sleep stages  or arousals from sleep   PLAN/RECOMMENDATIONS:  1. This study does not demonstrate any significant obstructive or central sleep disordered breathing with the exception of mild, supine REM sleep related OSA. Snoring was noted, mostly mild and intermittent. Treatment with positive airway pressure is not warranted; weight loss and avoiding the supine sleep position is recommended.  2. The study shows sleep  fragmentation and abnormal sleep stage percentages; these are nonspecific findings and per se do not signify an intrinsic sleep disorder or a cause for the patient's sleep-related symptoms. Causes include (but are not limited to) the first night effect of the sleep study, circadian rhythm disturbances, medication effect or an underlying mood disorder or medical problem.  3. The patient should be cautioned not to drive, work at heights, or operate dangerous or heavy equipment when tired or sleepy. Review and reiteration of good sleep hygiene measures should be pursued with any patient. 4. The patient will be advised to follow up with the referring provider, who will be notified of the test results.  I certify that I have reviewed the entire raw data recording prior to the issuance of this report in accordance with the Standards of Accreditation of the American Academy of Sleep Medicine (AASM)   Star Age, MD, PhD Diplomat, American Board of Neurology and Sleep Medicine (Neurology and Sleep Medicine)

## 2019-01-16 DIAGNOSIS — Z85828 Personal history of other malignant neoplasm of skin: Secondary | ICD-10-CM | POA: Diagnosis not present

## 2019-01-16 DIAGNOSIS — L570 Actinic keratosis: Secondary | ICD-10-CM | POA: Diagnosis not present

## 2019-01-30 DIAGNOSIS — E1129 Type 2 diabetes mellitus with other diabetic kidney complication: Secondary | ICD-10-CM | POA: Diagnosis not present

## 2019-02-05 DIAGNOSIS — Z23 Encounter for immunization: Secondary | ICD-10-CM | POA: Diagnosis not present

## 2019-02-11 DIAGNOSIS — Z86010 Personal history of colonic polyps: Secondary | ICD-10-CM | POA: Diagnosis not present

## 2019-02-11 DIAGNOSIS — K219 Gastro-esophageal reflux disease without esophagitis: Secondary | ICD-10-CM | POA: Diagnosis not present

## 2019-02-13 ENCOUNTER — Ambulatory Visit: Payer: Medicare Other | Admitting: Cardiology

## 2019-02-25 ENCOUNTER — Encounter: Payer: Self-pay | Admitting: Cardiology

## 2019-02-25 ENCOUNTER — Ambulatory Visit (INDEPENDENT_AMBULATORY_CARE_PROVIDER_SITE_OTHER): Payer: Medicare Other | Admitting: Cardiology

## 2019-02-25 ENCOUNTER — Other Ambulatory Visit: Payer: Self-pay

## 2019-02-25 VITALS — BP 125/67 | HR 58 | Temp 98.2°F | Ht 71.0 in | Wt 212.0 lb

## 2019-02-25 DIAGNOSIS — I10 Essential (primary) hypertension: Secondary | ICD-10-CM | POA: Diagnosis not present

## 2019-02-25 DIAGNOSIS — R002 Palpitations: Secondary | ICD-10-CM | POA: Diagnosis not present

## 2019-02-25 NOTE — Progress Notes (Signed)
Primary Physician/Referring:  Marton Redwood, MD  Patient ID: Joseph Nguyen, male    DOB: January 21, 1951, 68 y.o.   MRN: [id number]  Chief Complaint  Patient presents with  . Hypertension  . Palpitations  . Follow-up    56mo   HPI: Joseph Nguyen is a 68y.o. male  with hypertension, hyperlipidemia, controlled diabetes, who I had seen about a month ago, had been doing well but recently has noticed uncontrolled hypertension.  He also has h/o asymptomatic multiple cysts in the kidney without adverse effects.   Over the past 3-4 months BP has been elevated.  On his last office visit, due to palpitations and also hypertension I had added atenolol 50 mg daily.  He now presents for follow-up. He states he does not like his BP to e >120 to 1920mm Hg systolic. No chest pain no dizziness or syncope.   He has noticed improvement in palpitations and also blood pressure.  Occasionally he has noticed spiking blood pressure.  Past Medical History:  Diagnosis Date  . Controlled type 2 diabetes mellitus without complication ([place] 61/00/7121 . [place]   . Essential hypertension 07/27/2011  . Hiatal hernia   . Hypercholesterolemia   . Kidney disease    Polycystic,   . Mixed hyperlipidemia 11/05/2018  . Obesity   . Polycystic kidney disease 11/05/2018    Past Surgical History:  Procedure Laterality Date  . CARDIOVASCULAR STRESS TEST  10-18-2005   EF 79%  . TUMOR REMOVAL  1968   Left Breast Benign   Social History   Socioeconomic History  . Marital status: Married    Spouse name: Not on file  . Number of children: 3  . Years of education: Not on file  . Highest education level: Not on file  Occupational History  . Not on file  Social Needs  . Financial resource strain: Not on file  . Food insecurity    Worry: Not on file    Inability: Not on file  . Transportation needs    Medical: Not on file    Non-medical: Not on file  Tobacco Use  . Smoking status: Former Smoker     Packs/day: 0.25    Years: 5.00    Pack years: 1.25    Types: Cigarettes    Quit date: 02/25/1979    Years since quitting: 40.0  . Smokeless tobacco: Never Used  . Tobacco comment: Quite 357yr ago  Substance and Sexual Activity  . Alcohol use: Yes    Comment: occasionally  . Drug use: Not on file  . Sexual activity: Not on file  Lifestyle  . Physical activity    Days per week: Not on file    Minutes per session: Not on file  . Stress: Not on file  Relationships  . Social coHerbalistn phone: Not on file    Gets together: Not on file    Attends religious service: Not on file    Active member of club or organization: Not on file    Attends meetings of clubs or organizations: Not on file    Relationship status: Not on file  . Intimate partner violence    Fear of current or ex partner: Not on file    Emotionally abused: Not on file    Physically abused: Not on file    Forced sexual activity: Not on file  Other Topics Concern  . Not on file  Social History Narrative  . Not on file   Review of Systems  Constitution: Negative for chills, decreased appetite, malaise/fatigue and weight gain.  Cardiovascular: Positive for palpitations. Negative for chest pain, leg swelling and syncope.  Respiratory: Positive for snoring (occasional). Negative for sleep disturbances due to breathing and wheezing.   Endocrine: Negative for cold intolerance.  Hematologic/Lymphatic: Does not bruise/bleed easily.  Musculoskeletal: Negative for joint swelling.  Gastrointestinal: Negative for abdominal pain, anorexia, change in bowel habit, hematochezia and melena.  Neurological: Negative for headaches and light-headedness.  Psychiatric/Behavioral: Negative for depression and substance abuse.  All other systems reviewed and are negative.  Objective  Blood pressure 125/67, pulse (!) 58, temperature 98.2 F (36.8 C), height '5\' 11"'$  (1.803 m), weight 212 lb (96.2 kg), SpO2 95 %. Body mass  index is 29.57 kg/m.     Physical Exam  Constitutional: He appears well-developed and well-nourished. No distress.  HENT:  Head: Atraumatic.  Eyes: Conjunctivae are normal.  Neck: Neck supple. No JVD present. No thyromegaly present.  Cardiovascular: Normal rate, regular rhythm, normal heart sounds and intact distal pulses. Exam reveals no gallop.  No murmur heard. Pulmonary/Chest: Effort normal and breath sounds normal.  Abdominal: Soft. Bowel sounds are normal.  Musculoskeletal: Normal range of motion.  Neurological: He is alert.  Skin: Skin is warm and dry.  Psychiatric: He has a normal mood and affect.   Radiology: No results found.  Laboratory examination:   Labs 11/09/2018: CBC normal, serum glucose 166 minute grams, serum creatinine 1.24, eGFR 60 mL.  04/07/2017: Serum glucose 165 mg, BUN 23, creatinine 1.2, eGFR 60 ML, potassium 4.2. CBC normal.  Total cholesterol 138, tr...................................iglycerides 145, HDL 32, LDL 77. Non-HDL cholesterol 106. Apo B normal at 82, TSH normal, PSA normal.   Allergies   Allergies  Allergen Reactions  . Amlodipine Other (See Comments)    Edema  . Hydralazine Other (See Comments)    Chest pain and weakness  . Zocor [Simvastatin - High Dose] Other (See Comments)    myalgias    Medications   There are no discontinued medications. Current Meds  Medication Sig  . aspirin 325 MG tablet Take 325 mg by mouth daily.  Marland Kitchen atenolol (TENORMIN) 50 MG tablet TAKE 1 TABLET (50 MG TOTAL) BY MOUTH EVERY EVENING.  Haze Justin PROBIOTIC (BIOGAIA PROBIOTIC) LIQD Take by mouth daily at 8 pm.  . cholecalciferol (VITAMIN D3) 25 MCG (1000 UT) tablet Take 1,000 Units by mouth daily.  . Coenzyme Q10 (COQ10) 100 MG CAPS Take by mouth.  . Ferrous Sulfate (IRON) 142 (45 Fe) MG TBCR Take 1 tablet by mouth daily.  . metFORMIN (GLUCOPHAGE) 1000 MG tablet Take 1,000 mg by mouth 2 (two) times daily with a meal.   . multivitamin-lutein  (OCUVITE-LUTEIN) CAPS capsule Take 1 capsule by mouth daily.  . NON FORMULARY Take 2 tablets by mouth daily. cinsulin   . olmesartan-hydrochlorothiazide (BENICAR HCT) 40-12.5 MG tablet Take 1 tablet by mouth daily.  . Omega-3 Fatty Acids (FISH OIL) 1000 MG CAPS Take by mouth.  Marland Kitchen omeprazole (PRILOSEC) 40 MG capsule Take 40 mg by mouth daily.  . Potassium 99 MG TABS Take by mouth.  . rosuvastatin (CRESTOR) 10 MG tablet take 1 tablet by mouth once daily  . vitamin B-12 (CYANOCOBALAMIN) 1000 MCG tablet Take 1,000 mcg by mouth daily.  . vitamin C (ASCORBIC ACID) 500 MG tablet Take 500 mg by mouth daily.  . vitamin E 100 UNIT capsule Take by mouth daily.  Cardiac Studies:   Lexiscan Myoview stress test 11/10/2018 [facility]):  No evidence of ischemia, normal LVEF.  Low risk study.  Chest x-ray PA and lateral view normal.  Ultrasound of the abdomen revealed bilateral mild cystic disease of the kidney, findings suggestive of polycystic renal disease on cystic renal disease in the setting of renal failure.  No definite hydronephrosis bilaterally.  Assessment     ICD-10-CM   1. Palpitations  R00.2   2. Essential hypertension  I10     EKG 12/11/2018: Normal sinus rhythm at the rate of 68 bpm, normal axis, incomplete right bundle branch block.  Normal EKG. No significant change from  EKG 11/01/2017  Recommendations:   Patient extremely concerned about blood pressure, I had added atenolol 50 mg daily both for palpitations and hypertension. He has noticed significant improvement in palpitations.  Patient states that he has been having occasional episodes of elevated blood pressure at home, today the blood pressure was well controlled here.  His options include the following: Taking extra atenolol one half tablet on a p.r.n. basis Changing olmesartan HCTZ 40/12.5 mg to 40/25 mg every morning Changing to playing olmesartan and adding chlorthalidone 25 mg.  I'll send a note to his  PCP Dr. Judie Bonus shock, patient is comfortable in following with me on a p.r.n. basis.  I also discussed with him regarding changes in the diet that can impact his hypertension.  Also we discussed regarding weight loss.  This was a 14 minute OV encounter face to face.  Adrian Prows, MD, [facility] 02/25/2019, 11:29 AM [facility]. [place] Pager: [phone] Office: [phone] If no answer Cell [phone]

## 2019-03-30 ENCOUNTER — Other Ambulatory Visit: Payer: Self-pay | Admitting: Cardiology

## 2019-03-30 DIAGNOSIS — I10 Essential (primary) hypertension: Secondary | ICD-10-CM

## 2019-03-30 DIAGNOSIS — R002 Palpitations: Secondary | ICD-10-CM

## 2019-05-02 DIAGNOSIS — E7849 Other hyperlipidemia: Secondary | ICD-10-CM | POA: Diagnosis not present

## 2019-05-02 DIAGNOSIS — I10 Essential (primary) hypertension: Secondary | ICD-10-CM | POA: Diagnosis not present

## 2019-05-02 DIAGNOSIS — Z125 Encounter for screening for malignant neoplasm of prostate: Secondary | ICD-10-CM | POA: Diagnosis not present

## 2019-05-02 DIAGNOSIS — E1129 Type 2 diabetes mellitus with other diabetic kidney complication: Secondary | ICD-10-CM | POA: Diagnosis not present

## 2019-05-02 DIAGNOSIS — R82998 Other abnormal findings in urine: Secondary | ICD-10-CM | POA: Diagnosis not present

## 2019-06-06 DIAGNOSIS — E1129 Type 2 diabetes mellitus with other diabetic kidney complication: Secondary | ICD-10-CM | POA: Diagnosis not present

## 2019-06-06 DIAGNOSIS — Q613 Polycystic kidney, unspecified: Secondary | ICD-10-CM | POA: Diagnosis not present

## 2019-06-06 DIAGNOSIS — I129 Hypertensive chronic kidney disease with stage 1 through stage 4 chronic kidney disease, or unspecified chronic kidney disease: Secondary | ICD-10-CM | POA: Diagnosis not present

## 2019-06-06 DIAGNOSIS — K921 Melena: Secondary | ICD-10-CM | POA: Diagnosis not present

## 2019-06-06 DIAGNOSIS — N1832 Chronic kidney disease, stage 3b: Secondary | ICD-10-CM | POA: Diagnosis not present

## 2019-06-13 DIAGNOSIS — I129 Hypertensive chronic kidney disease with stage 1 through stage 4 chronic kidney disease, or unspecified chronic kidney disease: Secondary | ICD-10-CM | POA: Diagnosis not present

## 2019-06-13 DIAGNOSIS — N1832 Chronic kidney disease, stage 3b: Secondary | ICD-10-CM | POA: Diagnosis not present

## 2019-06-13 DIAGNOSIS — R001 Bradycardia, unspecified: Secondary | ICD-10-CM | POA: Diagnosis not present

## 2019-06-13 DIAGNOSIS — E1129 Type 2 diabetes mellitus with other diabetic kidney complication: Secondary | ICD-10-CM | POA: Diagnosis not present

## 2019-06-14 ENCOUNTER — Telehealth: Payer: Self-pay

## 2019-06-14 NOTE — Telephone Encounter (Signed)
We can see him back in the office and see. Needs EKG. Atenolol that he takes PRN can reduce HR. Hence BP medications may need to be changed

## 2019-06-14 NOTE — Telephone Encounter (Signed)
Pt called stating that his pulse has dropped from 60's down to 48/49 and his bp has spiked up to 140's/50's-60's. He has been a little more fatigued than normal but no other symptoms. Pt and spouse are concerned. Please advise. Has pending on 06/26/19.//ah

## 2019-06-14 NOTE — Telephone Encounter (Signed)
Discussed with pt and spouse Tye [place]. Call transferred to [facility] to schedule.//ah

## 2019-06-17 ENCOUNTER — Ambulatory Visit (INDEPENDENT_AMBULATORY_CARE_PROVIDER_SITE_OTHER): Payer: Medicare Other | Admitting: Cardiology

## 2019-06-17 ENCOUNTER — Other Ambulatory Visit: Payer: Self-pay

## 2019-06-17 ENCOUNTER — Encounter: Payer: Self-pay | Admitting: Cardiology

## 2019-06-17 ENCOUNTER — Ambulatory Visit: Payer: Medicare Other | Attending: Internal Medicine

## 2019-06-17 VITALS — BP 114/63 | HR 58 | Temp 97.1°F | Ht 71.0 in | Wt 210.6 lb

## 2019-06-17 DIAGNOSIS — I10 Essential (primary) hypertension: Secondary | ICD-10-CM

## 2019-06-17 DIAGNOSIS — R001 Bradycardia, unspecified: Secondary | ICD-10-CM | POA: Diagnosis not present

## 2019-06-17 DIAGNOSIS — R0609 Other forms of dyspnea: Secondary | ICD-10-CM | POA: Diagnosis not present

## 2019-06-17 DIAGNOSIS — Z23 Encounter for immunization: Secondary | ICD-10-CM | POA: Insufficient documentation

## 2019-06-17 MED ORDER — LABETALOL HCL 100 MG PO TABS: 100.0000 mg | ORAL_TABLET | Freq: Two times a day (BID) | ORAL | 1 refills | Status: DC

## 2019-06-17 NOTE — Patient Instructions (Addendum)
Stop Atenolol and start labetalol

## 2019-06-17 NOTE — Progress Notes (Signed)
   Covid-19 Vaccination Clinic  Name:  Joseph Nguyen    MRN: [id number] DOB: 07/20/1950  06/17/2019  [name] was observed post Covid-19 immunization for 15 minutes without incidence. He was provided with Vaccine Information Sheet and instruction to access the V-Safe system.   [name] was instructed to call 911 with any severe reactions post vaccine: Marland Kitchen Difficulty breathing  . Swelling of your face and throat  . A fast heartbeat  . A bad rash all over your body  . Dizziness and weakness    Immunizations Administered    Name Date Dose VIS Date Route   Pfizer COVID-19 Vaccine 06/17/2019  5:50 PM 0.3 mL 05/03/2019 Intramuscular   Manufacturer: [place]   Lot: [id number]   [place]: [id number]

## 2019-06-17 NOTE — Progress Notes (Signed)
Primary Physician/Referring:  Marton Redwood, MD  Patient ID: Joseph Nguyen, male    DOB: 05-30-1950, 69 y.o.   MRN: [id number]  Chief Complaint  Patient presents with  . Acute Visit    see pt message  . Bradycardia  . Hypertension    HPI: Joseph Nguyen  is a 69 y.o. male  with hypertension, hyperlipidemia, controlled diabetes, who I had seen about a month ago, had been doing well but recently has noticed uncontrolled hypertension.  He also has h/o asymptomatic multiple cysts in the kidney without adverse effects.   Over the last few weeks, patient has noted that his blood pressure has had significant variations in systolic readings from 563-893 within 15 minutes. He has also noted heart rate in the upper 40's.   He has not had any syncope or near syncope. He has noticed dyspnea on exertion with climbing stairs over the last 6 months. He was previously exercising at the gym regularly, but does not do this now due to the pandemic.  Past Medical History:  Diagnosis Date  . Controlled type 2 diabetes mellitus without complication ([place]) 7/34/2876  . DM (diabetes mellitus) ([place])   . Essential hypertension 07/27/2011  . Hiatal hernia   . Hypercholesterolemia   . Kidney disease    Polycystic,   . Mixed hyperlipidemia 11/05/2018  . Obesity   . Polycystic kidney disease 11/05/2018    Past Surgical History:  Procedure Laterality Date  . CARDIOVASCULAR STRESS TEST  10-18-2005   EF 79%  . TUMOR REMOVAL  1968   Left Breast Benign   Social History   Socioeconomic History  . Marital status: Married    Spouse name: Not on file  . Number of children: 3  . Years of education: Not on file  . Highest education level: Not on file  Occupational History  . Not on file  Tobacco Use  . Smoking status: Former Smoker    Packs/day: 0.25    Years: 5.00    Pack years: 1.25    Types: Cigarettes    Quit date: 02/25/1979    Years since quitting: 40.3  . Smokeless tobacco: Never Used  . Tobacco  comment: Quite 51yr+ ago  Substance and Sexual Activity  . Alcohol use: Yes    Comment: occasionally  . Drug use: Not on file  . Sexual activity: Not on file  Other Topics Concern  . Not on file  Social History Narrative  . Not on file   Social Determinants of Health   Financial Resource Strain:   . Difficulty of Paying Living Expenses: Not on file  Food Insecurity:   . Worried About RCharity fundraiserin the Last Year: Not on file  . Ran Out of Food in the Last Year: Not on file  Transportation Needs:   . Lack of Transportation (Medical): Not on file  . Lack of Transportation (Non-Medical): Not on file  Physical Activity:   . Days of Exercise per Week: Not on file  . Minutes of Exercise per Session: Not on file  Stress:   . Feeling of Stress : Not on file  Social Connections:   . Frequency of Communication with Friends and Family: Not on file  . Frequency of Social Gatherings with Friends and Family: Not on file  . Attends Religious Services: Not on file  . Active Member of Clubs or Organizations: Not on file  . Attends CArchivistMeetings: Not on file  .  Marital Status: Not on file  Intimate Partner Violence:   . Fear of Current or Ex-Partner: Not on file  . Emotionally Abused: Not on file  . Physically Abused: Not on file  . Sexually Abused: Not on file   Review of Systems  Constitution: Negative for chills, decreased appetite, malaise/fatigue and weight gain.  Cardiovascular: Positive for dyspnea on exertion and palpitations. Negative for chest pain, leg swelling and syncope.  Respiratory: Positive for snoring (occasional). Negative for sleep disturbances due to breathing and wheezing.   Endocrine: Negative for cold intolerance.  Hematologic/Lymphatic: Does not bruise/bleed easily.  Musculoskeletal: Negative for joint swelling.  Gastrointestinal: Negative for abdominal pain, anorexia, change in bowel habit, hematochezia and melena.  Neurological:  Negative for headaches and light-headedness.  Psychiatric/Behavioral: Negative for depression and substance abuse.  All other systems reviewed and are negative.  Objective  Blood pressure 114/63, pulse (!) 58, temperature (!) 97.1 F (36.2 C), height _0  (1.803 m), weight 210 lb 9.6 oz (95.5 kg), SpO2 96 %. Body mass index is 29.37 kg/m.     Physical Exam  Constitutional: He appears well-developed and well-nourished. No distress.  HENT:  Head: Atraumatic.  Eyes: Conjunctivae are normal.  Neck: No JVD present. No thyromegaly present.  Cardiovascular: Normal rate, regular rhythm, normal heart sounds and intact distal pulses. Exam reveals no gallop.  No murmur heard. Pulmonary/Chest: Effort normal and breath sounds normal.  Abdominal: Soft. Bowel sounds are normal.  Musculoskeletal:        General: Normal range of motion.     Cervical back: Neck supple.  Neurological: He is alert.  Skin: Skin is warm and dry.  Psychiatric: He has a normal mood and affect.   Radiology: No results found.  Laboratory examination:   Labs 11/09/2018: CBC normal, serum glucose 166 minute grams, serum creatinine 1.24, eGFR 60 mL.  04/07/2017: Serum glucose 165 mg, BUN 23, creatinine 1.2, eGFR 60 ML, potassium 4.2. CBC normal.  Total cholesterol 138, triglycerides 145, HDL 32, LDL 77. Non-HDL cholesterol 106. Apo B normal at 82, TSH normal, PSA normal.   Allergies   Allergies  Allergen Reactions  . Amlodipine Other (See Comments)    Edema  . Hydralazine Other (See Comments)    Chest pain and weakness  . Zocor [Simvastatin - High Dose] Other (See Comments)    myalgias    Medications   Medications Discontinued During This Encounter  Medication Reason  . Ferrous Sulfate (IRON) 142 (45 Fe) MG TBCR Discontinued by provider  . Potassium 99 MG TABS Discontinued by provider  . atenolol (TENORMIN) 50 MG tablet Discontinued by provider   Current Meds  Medication Sig  . aspirin 325 MG tablet Take  325 mg by mouth daily.  Marland Kitchen BIOGAIA PROBIOTIC (BIOGAIA PROBIOTIC) LIQD Take by mouth daily at 8 pm.  . cholecalciferol (VITAMIN D3) 25 MCG (1000 UT) tablet Take 1,000 Units by mouth daily.  . Coenzyme Q10 (COQ10) 100 MG CAPS Take by mouth.  . metFORMIN (GLUCOPHAGE) 1000 MG tablet Take 1,000 mg by mouth 2 (two) times daily with a meal.   . multivitamin-lutein (OCUVITE-LUTEIN) CAPS capsule Take 1 capsule by mouth daily.  . NON FORMULARY Take 2 tablets by mouth daily. cinsulin   . olmesartan-hydrochlorothiazide (BENICAR HCT) 40-25 MG tablet Take 1 tablet by mouth daily.   . Omega-3 Fatty Acids (FISH OIL) 1000 MG CAPS Take by mouth.  Marland Kitchen omeprazole (PRILOSEC) 40 MG capsule Take 40 mg by mouth daily.  . rosuvastatin (CRESTOR)  10 MG tablet take 1 tablet by mouth once daily  . vitamin B-12 (CYANOCOBALAMIN) 1000 MCG tablet Take 1,000 mcg by mouth daily.  . vitamin C (ASCORBIC ACID) 500 MG tablet Take 500 mg by mouth daily.  . vitamin E 100 UNIT capsule Take by mouth daily.  . [DISCONTINUED] atenolol (TENORMIN) 50 MG tablet TAKE 1 TABLET (50 MG TOTAL) BY MOUTH EVERY EVENING.    Cardiac Studies:   Lexiscan Myoview stress test 11/10/2018 [facility]):  No evidence of ischemia, normal LVEF.  Low risk study.  Chest x-ray PA and lateral view normal.  Ultrasound of the abdomen revealed bilateral mild cystic disease of the kidney, findings suggestive of polycystic renal disease on cystic renal disease in the setting of renal failure.  No definite hydronephrosis bilaterally.  Assessment     ICD-10-CM   1. Bradycardia  R00.1   2. Essential hypertension  I10 EKG 12-Lead  3. Dyspnea on exertion  R06.00 PCV ECHOCARDIOGRAM COMPLETE    EKG 06/17/2019: Sinus bradycardia at 57 bpm, normal axis, no evidence of ischemia.   Recommendations:   Patient made an appointment to see me today due to concerns of bradycardia and spikes in his blood pressure. He is not markedly bradycardic today, but at home  heart rate has been in the high 40's which is concerning to him. He does not watch his heart rate with exertion, but does not seem to have any chronotropic incompetence by his symptoms. Bradycardia is likely related to his atenolol, which was explained to the patient. Will discontinue this and change to labetalol 100 mg BID. He has been intolerant to hydralazine and amlodipine in the past. Also reports that his kidney function is depressed.   He has noticed some dyspnea on exertion, potentially related to inactivity.Given his variations in blood pressure, bradycardia, and newly noted dyspnea, will obtain echocardiogram to establish baseline. I will plan to see him back in 3 weeks for follow up on his bradycardia and hypertension.  Miquel Dunn, MSN, APRN, FNP-C [facility]. [place] Office: [phone] Fax: [phone]

## 2019-06-18 ENCOUNTER — Ambulatory Visit (INDEPENDENT_AMBULATORY_CARE_PROVIDER_SITE_OTHER): Payer: Medicare Other

## 2019-06-18 ENCOUNTER — Encounter: Payer: Self-pay | Admitting: Cardiology

## 2019-06-18 DIAGNOSIS — N2581 Secondary hyperparathyroidism of renal origin: Secondary | ICD-10-CM | POA: Diagnosis not present

## 2019-06-18 DIAGNOSIS — D638 Anemia in other chronic diseases classified elsewhere: Secondary | ICD-10-CM | POA: Diagnosis not present

## 2019-06-18 DIAGNOSIS — E785 Hyperlipidemia, unspecified: Secondary | ICD-10-CM | POA: Diagnosis not present

## 2019-06-18 DIAGNOSIS — R0609 Other forms of dyspnea: Secondary | ICD-10-CM | POA: Diagnosis not present

## 2019-06-18 DIAGNOSIS — N183 Chronic kidney disease, stage 3 unspecified: Secondary | ICD-10-CM | POA: Diagnosis not present

## 2019-06-18 DIAGNOSIS — Q612 Polycystic kidney, adult type: Secondary | ICD-10-CM | POA: Diagnosis not present

## 2019-06-18 DIAGNOSIS — E1122 Type 2 diabetes mellitus with diabetic chronic kidney disease: Secondary | ICD-10-CM | POA: Diagnosis not present

## 2019-06-18 DIAGNOSIS — I129 Hypertensive chronic kidney disease with stage 1 through stage 4 chronic kidney disease, or unspecified chronic kidney disease: Secondary | ICD-10-CM | POA: Diagnosis not present

## 2019-06-21 DIAGNOSIS — E1129 Type 2 diabetes mellitus with other diabetic kidney complication: Secondary | ICD-10-CM | POA: Diagnosis not present

## 2019-06-24 ENCOUNTER — Telehealth: Payer: Self-pay

## 2019-06-24 DIAGNOSIS — K921 Melena: Secondary | ICD-10-CM | POA: Diagnosis not present

## 2019-06-24 NOTE — Telephone Encounter (Signed)
Pt called wanting to know echo results due to pt having kidney issues and creatinine high as well as blood in stool. PCP wanting to know so they can know what to do next.//ah

## 2019-06-24 NOTE — Telephone Encounter (Signed)
Normal heart function, minor abnormalities.

## 2019-06-24 NOTE — Telephone Encounter (Signed)
Pt spouse called back wanting results

## 2019-06-25 NOTE — Telephone Encounter (Signed)
Wife called for pt echo results. Inform her that the results were normal heart functions. Wife mention that she would like to see Dr. Einar Gip. Inform her that she would need to ask at the time of visit.

## 2019-06-25 NOTE — Progress Notes (Signed)
Called pt to inform him about his echo results. Pt understood

## 2019-06-26 ENCOUNTER — Ambulatory Visit: Payer: Medicare Other | Admitting: Cardiology

## 2019-06-26 DIAGNOSIS — Z1152 Encounter for screening for COVID-19: Secondary | ICD-10-CM | POA: Diagnosis not present

## 2019-06-26 DIAGNOSIS — J3489 Other specified disorders of nose and nasal sinuses: Secondary | ICD-10-CM | POA: Diagnosis not present

## 2019-06-26 DIAGNOSIS — J019 Acute sinusitis, unspecified: Secondary | ICD-10-CM | POA: Diagnosis not present

## 2019-06-27 ENCOUNTER — Other Ambulatory Visit: Payer: Self-pay | Admitting: Cardiology

## 2019-06-27 DIAGNOSIS — I10 Essential (primary) hypertension: Secondary | ICD-10-CM

## 2019-06-27 DIAGNOSIS — R002 Palpitations: Secondary | ICD-10-CM

## 2019-06-28 ENCOUNTER — Other Ambulatory Visit: Payer: Self-pay

## 2019-06-28 DIAGNOSIS — E1122 Type 2 diabetes mellitus with diabetic chronic kidney disease: Secondary | ICD-10-CM | POA: Diagnosis not present

## 2019-06-28 DIAGNOSIS — Q612 Polycystic kidney, adult type: Secondary | ICD-10-CM | POA: Diagnosis not present

## 2019-06-28 DIAGNOSIS — N183 Chronic kidney disease, stage 3 unspecified: Secondary | ICD-10-CM | POA: Diagnosis not present

## 2019-06-28 DIAGNOSIS — D638 Anemia in other chronic diseases classified elsewhere: Secondary | ICD-10-CM | POA: Diagnosis not present

## 2019-06-28 DIAGNOSIS — N2581 Secondary hyperparathyroidism of renal origin: Secondary | ICD-10-CM | POA: Diagnosis not present

## 2019-06-28 DIAGNOSIS — E785 Hyperlipidemia, unspecified: Secondary | ICD-10-CM | POA: Diagnosis not present

## 2019-06-28 DIAGNOSIS — I129 Hypertensive chronic kidney disease with stage 1 through stage 4 chronic kidney disease, or unspecified chronic kidney disease: Secondary | ICD-10-CM | POA: Diagnosis not present

## 2019-07-01 ENCOUNTER — Ambulatory Visit (INDEPENDENT_AMBULATORY_CARE_PROVIDER_SITE_OTHER): Payer: Medicare Other | Admitting: Cardiology

## 2019-07-01 ENCOUNTER — Other Ambulatory Visit: Payer: Self-pay

## 2019-07-01 ENCOUNTER — Encounter: Payer: Self-pay | Admitting: Cardiology

## 2019-07-01 VITALS — BP 119/66 | HR 63 | Temp 97.2°F | Ht 71.0 in | Wt 209.0 lb

## 2019-07-01 DIAGNOSIS — I10 Essential (primary) hypertension: Secondary | ICD-10-CM | POA: Diagnosis not present

## 2019-07-01 DIAGNOSIS — R001 Bradycardia, unspecified: Secondary | ICD-10-CM

## 2019-07-01 DIAGNOSIS — E782 Mixed hyperlipidemia: Secondary | ICD-10-CM | POA: Diagnosis not present

## 2019-07-01 NOTE — Progress Notes (Signed)
Primary Physician/Referring:  Marton Redwood, MD  Patient ID: Joseph Nguyen, male    DOB: March 18, 1951, 69 y.o.   MRN: [id number]  Chief Complaint  Patient presents with  . Hypertension  . Bradycardia  . Follow-up    2 weeks    HPI: Joseph Nguyen  is a 69 y.o. male  with hypertension, hyperlipidemia, and controlled diabetes. He also has h/o asymptomatic multiple cysts in the kidney that he states has slightly progressed. He was last seen 3 weeks ago for concern of bradycardia and variations of blood pressure. Atenolol was discontinued and he was changed to labetalol. He now presents for follow up.   Blood pressure has overall been stable with recent medication changes. Heart rate continues to be borderline low but stable. Tolerating medications well.   He has not had any syncope or near syncope. He has noticed dyspnea on exertion with climbing stairs over the last 6 months. He was previously exercising at the gym regularly, but does not do this now due to the pandemic. He recently had echocardiogram and presents to discuss results.   Past Medical History:  Diagnosis Date  . Controlled type 2 diabetes mellitus without complication ([place]) 08/29/8117  . DM (diabetes mellitus) ([place])   . Essential hypertension 07/27/2011  . Hiatal hernia   . Hypercholesterolemia   . Kidney disease    Polycystic,   . Mixed hyperlipidemia 11/05/2018  . Obesity   . Polycystic kidney disease 11/05/2018    Past Surgical History:  Procedure Laterality Date  . CARDIOVASCULAR STRESS TEST  10-18-2005   EF 79%  . TUMOR REMOVAL  1968   Left Breast Benign   Social History   Socioeconomic History  . Marital status: Married    Spouse name: Not on file  . Number of children: 3  . Years of education: Not on file  . Highest education level: Not on file  Occupational History  . Not on file  Tobacco Use  . Smoking status: Former Smoker    Packs/day: 0.25    Years: 5.00    Pack years: 1.25    Types: Cigarettes     Quit date: 02/25/1979    Years since quitting: 40.3  . Smokeless tobacco: Never Used  . Tobacco comment: Quite 60yr+ ago  Substance and Sexual Activity  . Alcohol use: Yes    Comment: occasionally  . Drug use: Not on file  . Sexual activity: Not on file  Other Topics Concern  . Not on file  Social History Narrative  . Not on file   Social Determinants of Health   Financial Resource Strain:   . Difficulty of Paying Living Expenses: Not on file  Food Insecurity:   . Worried About RCharity fundraiserin the Last Year: Not on file  . Ran Out of Food in the Last Year: Not on file  Transportation Needs:   . Lack of Transportation (Medical): Not on file  . Lack of Transportation (Non-Medical): Not on file  Physical Activity:   . Days of Exercise per Week: Not on file  . Minutes of Exercise per Session: Not on file  Stress:   . Feeling of Stress : Not on file  Social Connections:   . Frequency of Communication with Friends and Family: Not on file  . Frequency of Social Gatherings with Friends and Family: Not on file  . Attends Religious Services: Not on file  . Active Member of Clubs or Organizations: Not on file  .  Attends Archivist Meetings: Not on file  . Marital Status: Not on file  Intimate Partner Violence:   . Fear of Current or Ex-Partner: Not on file  . Emotionally Abused: Not on file  . Physically Abused: Not on file  . Sexually Abused: Not on file   Review of Systems  Constitution: Negative for chills, decreased appetite, malaise/fatigue and weight gain.  Cardiovascular: Positive for dyspnea on exertion and palpitations. Negative for chest pain, leg swelling and syncope.  Respiratory: Positive for snoring (occasional). Negative for sleep disturbances due to breathing and wheezing.   Endocrine: Negative for cold intolerance.  Hematologic/Lymphatic: Does not bruise/bleed easily.  Musculoskeletal: Negative for joint swelling.  Gastrointestinal: Negative  for abdominal pain, anorexia, change in bowel habit, hematochezia and melena.  Neurological: Negative for headaches and light-headedness.  Psychiatric/Behavioral: Negative for depression and substance abuse.  All other systems reviewed and are negative.  Objective  Blood pressure 119/66, pulse 63, temperature (!) 97.2 F (36.2 C), height 5' 11"  (1.803 m), weight 209 lb (94.8 kg), SpO2 96 %. Body mass index is 29.15 kg/m.   BP/HR for Feb at home has ranged from 003-704 systolic, 88-89 diastolic, heart rate 16-94    Physical Exam  Constitutional: He appears well-developed and well-nourished. No distress.  HENT:  Head: Atraumatic.  Eyes: Conjunctivae are normal.  Neck: No JVD present. No thyromegaly present.  Cardiovascular: Normal rate, regular rhythm, normal heart sounds and intact distal pulses. Exam reveals no gallop.  No murmur heard. Pulmonary/Chest: Effort normal and breath sounds normal.  Abdominal: Soft. Bowel sounds are normal.  Musculoskeletal:        General: Normal range of motion.     Cervical back: Neck supple.  Neurological: He is alert.  Skin: Skin is warm and dry.  Psychiatric: He has a normal mood and affect.   Radiology: No results found.  Laboratory examination:   Labs 11/09/2018: CBC normal, serum glucose 166 minute grams, serum creatinine 1.24, eGFR 60 mL.  04/07/2017: Serum glucose 165 mg, BUN 23, creatinine 1.2, eGFR 60 ML, potassium 4.2. CBC normal.  Total cholesterol 138, triglycerides 145, HDL 32, LDL 77. Non-HDL cholesterol 106. Apo B normal at 82, TSH normal, PSA normal.   Allergies   Allergies  Allergen Reactions  . Amlodipine Other (See Comments)    Edema  . Hydralazine Other (See Comments)    Chest pain and weakness  . Zocor [Simvastatin - High Dose] Other (See Comments)    myalgias    Medications   There are no discontinued medications. Current Meds  Medication Sig  . aspirin 325 MG tablet Take 81 mg by mouth daily.   Marland Kitchen BIOGAIA  PROBIOTIC (BIOGAIA PROBIOTIC) LIQD Take by mouth daily at 8 pm.  . cefdinir (OMNICEF) 300 MG capsule Take 300 mg by mouth 2 (two) times daily.  . cholecalciferol (VITAMIN D3) 25 MCG (1000 UT) tablet Take 1,000 Units by mouth daily.  . Coenzyme Q10 (COQ10) 100 MG CAPS Take by mouth.  . fluticasone (FLONASE) 50 MCG/ACT nasal spray Place 2 sprays into both nostrils daily.  Marland Kitchen labetalol (NORMODYNE) 100 MG tablet Take 1 tablet (100 mg total) by mouth 2 (two) times daily.  . metFORMIN (GLUCOPHAGE) 1000 MG tablet Take 1,000 mg by mouth 2 (two) times daily with a meal.   . multivitamin-lutein (OCUVITE-LUTEIN) CAPS capsule Take 1 capsule by mouth daily.  . NON FORMULARY Take 2 tablets by mouth daily. cinsulin   . olmesartan-hydrochlorothiazide (BENICAR HCT) 40-25 MG tablet  Take 1 tablet by mouth daily.   . Omega-3 Fatty Acids (FISH OIL) 1000 MG CAPS Take by mouth.  Marland Kitchen omeprazole (PRILOSEC) 40 MG capsule Take 40 mg by mouth daily.  . rosuvastatin (CRESTOR) 10 MG tablet take 1 tablet by mouth once daily  . vitamin B-12 (CYANOCOBALAMIN) 1000 MCG tablet Take 1,000 mcg by mouth daily.  . vitamin C (ASCORBIC ACID) 500 MG tablet Take 500 mg by mouth daily.  . vitamin E 100 UNIT capsule Take by mouth daily.    Cardiac Studies:   Echocardiogram 06/18/2019:  Normal LV systolic function with EF 66%. Left ventricle cavity is normal  in size. Normal global wall motion. Normal diastolic filling pattern.  Calculated EF 66%.  Left atrial cavity is mildly dilated at 4.0 cm and 51 mL. Normal  interatrial septum.  Structurally normal tricuspid valve. Mild tricuspid regurgitation. No  evidence of pulmonary hypertension.  Structurally normal pulmonic valve. Mild pulmonic regurgitation.  Lexiscan Myoview stress test 11/10/2018 [facility]):  No evidence of ischemia, normal LVEF.  Low risk study.  Chest x-ray PA and lateral view normal.  Ultrasound of the abdomen revealed bilateral mild cystic disease  of the kidney, findings suggestive of polycystic renal disease on cystic renal disease in the setting of renal failure.  No definite hydronephrosis bilaterally.  Assessment     ICD-10-CM   1. Bradycardia  R00.1   2. Essential hypertension  I10   3. Mixed hyperlipidemia  E78.2     EKG 06/17/2019: Sinus bradycardia at 57 bpm, normal axis, no evidence of ischemia.   Recommendations:   I have reviewed and discussed echocardiogram findings with the patient. Normal LVEF. I suspect that his dyspnea on exertion is related to not being as active over the last several months, encouraged him to try to be more active to see if this will improve. No chest pain.   Blood pressure is well controlled with current medications and appears to be much more stable. Heart rate has also been stable and improved. Will continue with Labetalol and Benicar HCT. He is followed by Nephrology for management of polycystic kidney disease that patient states has slightly progressed. I will defer to Nephrology if he can continue with Benicar HCT. I have asked patient to contact me if further adjustments need to be made. He has been intolerant to amlodipine and hydralazine in the past. Will plan to see him back in 3 months for follow up on hypertension.    CC: Dr. Donato Heinz ([place])  Miquel Dunn, MSN, APRN, [facility]. [place] Office: [phone] Fax: [phone]

## 2019-07-02 ENCOUNTER — Encounter: Payer: Self-pay | Admitting: Cardiology

## 2019-07-04 DIAGNOSIS — K2100 Gastro-esophageal reflux disease with esophagitis, without bleeding: Secondary | ICD-10-CM | POA: Diagnosis not present

## 2019-07-04 DIAGNOSIS — Z86010 Personal history of colonic polyps: Secondary | ICD-10-CM | POA: Diagnosis not present

## 2019-07-04 DIAGNOSIS — R195 Other fecal abnormalities: Secondary | ICD-10-CM | POA: Diagnosis not present

## 2019-07-05 ENCOUNTER — Other Ambulatory Visit: Payer: Self-pay | Admitting: Nephrology

## 2019-07-05 DIAGNOSIS — N1831 Chronic kidney disease, stage 3a: Secondary | ICD-10-CM

## 2019-07-08 ENCOUNTER — Ambulatory Visit: Payer: Medicare Other | Attending: Internal Medicine

## 2019-07-08 DIAGNOSIS — Z23 Encounter for immunization: Secondary | ICD-10-CM

## 2019-07-08 NOTE — Progress Notes (Signed)
   Covid-19 Vaccination Clinic  Name:  Joseph Nguyen    MRN: [id number] DOB: [birth year]  07/08/2019  [name] was observed post Covid-19 immunization for 15 minutes without incidence. He was provided with Vaccine Information Sheet and instruction to access the V-Safe system.   [name] was instructed to call 911 with any severe reactions post vaccine: Marland Kitchen Difficulty breathing  . Swelling of your face and throat  . A fast heartbeat  . A bad rash all over your body  . Dizziness and weakness    Immunizations Administered    Name Date Dose VIS Date Route   Pfizer COVID-19 Vaccine 07/08/2019  1:03 PM 0.3 mL 05/03/2019 Intramuscular   Manufacturer: [place]   Lot: [id number]   [place]: [id number]

## 2019-07-10 ENCOUNTER — Other Ambulatory Visit: Payer: Self-pay

## 2019-07-10 ENCOUNTER — Encounter: Payer: Medicare Other | Attending: Internal Medicine | Admitting: Dietician

## 2019-07-10 ENCOUNTER — Ambulatory Visit
Admission: RE | Admit: 2019-07-10 | Discharge: 2019-07-10 | Disposition: A | Discharge status: Home / Self Care | Payer: Medicare Other | Source: Ambulatory Visit | Attending: Nephrology | Admitting: Nephrology

## 2019-07-10 DIAGNOSIS — Z794 Long term (current) use of insulin: Secondary | ICD-10-CM | POA: Diagnosis not present

## 2019-07-10 DIAGNOSIS — N1831 Chronic kidney disease, stage 3a: Secondary | ICD-10-CM

## 2019-07-10 DIAGNOSIS — E0822 Diabetes mellitus due to underlying condition with diabetic chronic kidney disease: Secondary | ICD-10-CM | POA: Diagnosis not present

## 2019-07-10 DIAGNOSIS — N183 Chronic kidney disease, stage 3 unspecified: Secondary | ICD-10-CM | POA: Diagnosis not present

## 2019-07-10 NOTE — Progress Notes (Signed)
Medical Nutrition Therapy:  Appt start time: 0810 end time:  0930.   Assessment:  Primary concerns today: .  Patient is here today with his wife.  He would like to figure out a good diet for diabetes as well as his kidney problem.   MD recommended a plant based diet. His wife has read the [employer] Diet book as well as [employer].  Histoyr includes Type 2 Diabetes since 2007, CKD 3B, and Polycystic Kidney Disease. Patient reported labs of A1C 6.6 and creatinine 1.88.  He reports a high potassium at one time but does not know if he was on the Spironlactone at that time.  Labs were not available to me for this appointment. Medications include:  Metformin and vitamin B-12 Weight hx:  207 lbs today in the office (199 lbs today at home without clothing), 214 lbs 1 month ago at home.  Patient lives with his wife.  He is a retired Engineer, maintenance (IT).  They have been eating many plant based meals and ask specifically for lunch ideas.  Preferred Learning Style:   No preference indicated   Learning Readiness:   Ready  Change in progress    DIETARY INTAKE: Everyday foods include Mrs. Dash.  Avoided foods include salt.    24-hr recall:  B ( AM): Cereal, skim milk or occasional almond milk, occasional toast and occasional 1 egg OR oatmeal or cream of wheat OR pancakes with sugar free syrup  AND occasional banana Snk ( AM):   L ( PM): apple or berries or carrots and celery, 1/2 sandwich OR soup and sandwich OR salad with beans OR leftovers Snk ( PM): occasional almonds or peanuts or protein bar D ( PM): beans and vegetables or meat and vegetable or salad, occasional sweet potato Snk ( PM): skinny pop popcorn Beverages: Water, occasional unsweetened tea, decaf coffee with powdered fat free creamer, was drinking a lot of diet soda but has stopped these  Usual physical activity: golf weekly in the spring, was going to the gym 3 times weekly prior to covid and now walks 3-4 times per week for 40 minutes,  yard work in the summer  Estimated energy needs: 2400 calories 70 g protein  Progress Towards Goal(s):  In progress.   Nutritional Diagnosis:  NB-1.1 Food and nutrition-related knowledge deficit As related to balance of carbohydrates, protein and fat for CKD using a plant based diet.  As evidenced by patient report.    Intervention:  Nutrition education related to nutrition guidelines for CKD and DM and how the plant based diet can be beneficial.  Discussed that it is not necessary to restrict potassium rich foods unless his blood potassium is high.  Discussed that he should not use any salt substitutes with potassium in the ingredient list.  Discussed that he should not use any foods with phos... in the ingredient list. Discussed the plant based diet and plant based protein sources.  Discussed carbohydrates and why a plant based diet can also be beneficial for diabetes and that a plant based proteins are better for the kidney's to metabolize than animal protein.  Discussed pure plant based vs mostly plant based.  Discussed resources and meal ideas. Discussed making lifestyle habits that are sustainable.  Teaching Method Utilized:  Visual Auditory  Handouts given during visit include:  NKD National Kidney Diet placemat  Diabetes Resource page  NDES Plant based primer basics of plant based eating and resources  Barriers to learning/adherence to lifestyle change: none  Demonstrated degree of understanding via:  Teach Back   Monitoring/Evaluation:  Dietary intake, exercise, and body weight prn.

## 2019-07-11 ENCOUNTER — Encounter: Payer: Self-pay | Admitting: Dietician

## 2019-07-11 ENCOUNTER — Encounter: Payer: Medicare Other | Admitting: Dietician

## 2019-07-18 DIAGNOSIS — I10 Essential (primary) hypertension: Secondary | ICD-10-CM | POA: Diagnosis not present

## 2019-07-18 DIAGNOSIS — N183 Chronic kidney disease, stage 3 unspecified: Secondary | ICD-10-CM | POA: Diagnosis not present

## 2019-07-22 DIAGNOSIS — Z1159 Encounter for screening for other viral diseases: Secondary | ICD-10-CM | POA: Diagnosis not present

## 2019-07-25 DIAGNOSIS — K5730 Diverticulosis of large intestine without perforation or abscess without bleeding: Secondary | ICD-10-CM | POA: Diagnosis not present

## 2019-07-25 DIAGNOSIS — Z86010 Personal history of colonic polyps: Secondary | ICD-10-CM | POA: Diagnosis not present

## 2019-07-25 DIAGNOSIS — D123 Benign neoplasm of transverse colon: Secondary | ICD-10-CM | POA: Diagnosis not present

## 2019-07-25 DIAGNOSIS — D122 Benign neoplasm of ascending colon: Secondary | ICD-10-CM | POA: Diagnosis not present

## 2019-07-25 DIAGNOSIS — D124 Benign neoplasm of descending colon: Secondary | ICD-10-CM | POA: Diagnosis not present

## 2019-07-25 DIAGNOSIS — R195 Other fecal abnormalities: Secondary | ICD-10-CM | POA: Diagnosis not present

## 2019-07-25 DIAGNOSIS — D125 Benign neoplasm of sigmoid colon: Secondary | ICD-10-CM | POA: Diagnosis not present

## 2019-07-25 DIAGNOSIS — K648 Other hemorrhoids: Secondary | ICD-10-CM | POA: Diagnosis not present

## 2019-07-30 DIAGNOSIS — D125 Benign neoplasm of sigmoid colon: Secondary | ICD-10-CM | POA: Diagnosis not present

## 2019-07-30 DIAGNOSIS — D123 Benign neoplasm of transverse colon: Secondary | ICD-10-CM | POA: Diagnosis not present

## 2019-07-30 DIAGNOSIS — D124 Benign neoplasm of descending colon: Secondary | ICD-10-CM | POA: Diagnosis not present

## 2019-07-30 DIAGNOSIS — D122 Benign neoplasm of ascending colon: Secondary | ICD-10-CM | POA: Diagnosis not present

## 2019-08-01 DIAGNOSIS — E1129 Type 2 diabetes mellitus with other diabetic kidney complication: Secondary | ICD-10-CM | POA: Diagnosis not present

## 2019-08-01 DIAGNOSIS — E119 Type 2 diabetes mellitus without complications: Secondary | ICD-10-CM | POA: Diagnosis not present

## 2019-08-01 DIAGNOSIS — E1122 Type 2 diabetes mellitus with diabetic chronic kidney disease: Secondary | ICD-10-CM | POA: Diagnosis not present

## 2019-08-02 DIAGNOSIS — J310 Chronic rhinitis: Secondary | ICD-10-CM | POA: Diagnosis not present

## 2019-08-02 DIAGNOSIS — J320 Chronic maxillary sinusitis: Secondary | ICD-10-CM | POA: Diagnosis not present

## 2019-08-12 ENCOUNTER — Other Ambulatory Visit: Payer: Self-pay | Admitting: Cardiology

## 2019-09-04 DIAGNOSIS — E1122 Type 2 diabetes mellitus with diabetic chronic kidney disease: Secondary | ICD-10-CM | POA: Diagnosis not present

## 2019-09-13 DIAGNOSIS — J320 Chronic maxillary sinusitis: Secondary | ICD-10-CM | POA: Diagnosis not present

## 2019-09-13 DIAGNOSIS — J310 Chronic rhinitis: Secondary | ICD-10-CM | POA: Diagnosis not present

## 2019-09-27 ENCOUNTER — Ambulatory Visit: Payer: Medicare Other | Admitting: Cardiology

## 2019-10-02 DIAGNOSIS — L821 Other seborrheic keratosis: Secondary | ICD-10-CM | POA: Diagnosis not present

## 2019-10-02 DIAGNOSIS — E785 Hyperlipidemia, unspecified: Secondary | ICD-10-CM | POA: Diagnosis not present

## 2019-10-02 DIAGNOSIS — N2581 Secondary hyperparathyroidism of renal origin: Secondary | ICD-10-CM | POA: Diagnosis not present

## 2019-10-02 DIAGNOSIS — Z85828 Personal history of other malignant neoplasm of skin: Secondary | ICD-10-CM | POA: Diagnosis not present

## 2019-10-02 DIAGNOSIS — E1122 Type 2 diabetes mellitus with diabetic chronic kidney disease: Secondary | ICD-10-CM | POA: Diagnosis not present

## 2019-10-02 DIAGNOSIS — I129 Hypertensive chronic kidney disease with stage 1 through stage 4 chronic kidney disease, or unspecified chronic kidney disease: Secondary | ICD-10-CM | POA: Diagnosis not present

## 2019-10-02 DIAGNOSIS — L570 Actinic keratosis: Secondary | ICD-10-CM | POA: Diagnosis not present

## 2019-10-02 DIAGNOSIS — Q612 Polycystic kidney, adult type: Secondary | ICD-10-CM | POA: Diagnosis not present

## 2019-10-02 DIAGNOSIS — N183 Chronic kidney disease, stage 3 unspecified: Secondary | ICD-10-CM | POA: Diagnosis not present

## 2019-10-02 DIAGNOSIS — D638 Anemia in other chronic diseases classified elsewhere: Secondary | ICD-10-CM | POA: Diagnosis not present

## 2019-10-02 NOTE — Progress Notes (Signed)
Primary Physician/Referring:  Marton Redwood, MD  Patient ID: Joseph Nguyen, male    DOB: 03-Jul-1950, 69 y.o.   MRN: [id number]  Chief Complaint  Patient presents with  . Hypertension  . Follow-up    3 month   HPI:    Joseph Nguyen  is a 69 y.o. male  with hypertension, hyperlipidemia, and controlled diabetes. He also has h/o asymptomatic multiple cysts in the kidney that he states has slightly progressed and noted to have mild proteinuria by Dr. Marval Regal.  He has had marked fluctuation in blood pressure and states that since being on present medications, blood pressure and heart rate has been very stable and he is presently asymptomatic.  Past Medical History:  Diagnosis Date  . Controlled type 2 diabetes mellitus without complication ([place]) 08/29/8117  . DM (diabetes mellitus) ([place])   . Essential hypertension 07/27/2011  . Hiatal hernia   . Hypercholesterolemia   . Kidney disease    Polycystic,   . Mixed hyperlipidemia 11/05/2018  . Obesity   . Polycystic kidney disease 11/05/2018   Past Surgical History:  Procedure Laterality Date  . CARDIOVASCULAR STRESS TEST  10-18-2005   EF 79%  . TUMOR REMOVAL  1968   Left Breast Benign   Family History  Problem Relation Age of Onset  . Cancer Mother     Social History   Tobacco Use  . Smoking status: Former Smoker    Packs/day: 0.25    Years: 5.00    Pack years: 1.25    Types: Cigarettes    Quit date: 02/25/1979    Years since quitting: 40.6  . Smokeless tobacco: Never Used  . Tobacco comment: Quite 45yr+ ago  Substance Use Topics  . Alcohol use: Yes    Comment: occasionally   Marital Status: Married  ROS  Review of Systems  Cardiovascular: Negative for chest pain, dyspnea on exertion, leg swelling, palpitations and syncope.  Respiratory: Positive for snoring (occasional).    Objective  Blood pressure 130/68, pulse (!) 54, temperature (!) 97.2 F (36.2 C), temperature source Temporal, resp. rate 16, height 5' 11"   (1.803 m), weight 200 lb 12.8 oz (91.1 kg), SpO2 93 %.  Vitals with BMI 10/03/2019 07/11/2019 07/01/2019  Height 5' 11"  5' 11"  5' 11"   Weight 200 lbs 13 oz 207 lbs 209 lbs  BMI 28.02 [phone] Systolic 1213- 1086 Diastolic 68 - 66  Pulse 54 - 63     Physical Exam  Constitutional: He appears well-developed and well-nourished. No distress.  Cardiovascular: Normal rate, regular rhythm and intact distal pulses. Exam reveals no gallop.  No murmur heard. No leg edema. No JVD.    Pulmonary/Chest: Effort normal and breath sounds normal. No accessory muscle usage. No respiratory distress.  Abdominal: Soft.   Laboratory examination:   No results for input(s): NA, K, CL, CO2, GLUCOSE, BUN, CREATININE, CALCIUM, GFRNONAA, GFRAA in the last 8760 hours. CrCl cannot be calculated (Patient's most recent lab result is older than the maximum 21 days allowed.).  CMP Latest Ref Rng & Units 07/30/2010  Glucose 70 - 99 mg/dL 101(H)  BUN 6 - 23 mg/dL 21  Creatinine 0.40 - 1.50 mg/dL 1.11  Sodium 135 - 145 mEq/L 141  Potassium 3.5 - 5.3 mEq/L 4.5  Chloride 96 - 112 mEq/L 105  CO2 19 - 32 mEq/L 25  Calcium 8.4 - 10.5 mg/dL 9.9  Total Protein 6.0 - 8.3 g/dL 7.3  Total Bilirubin 0.3 - 1.2 mg/dL  0.8  Alkaline Phos 39 - 117 U/L 61  AST 0 - 37 U/L 21  ALT 0 - 53 U/L 21   No flowsheet data found. Lipid Panel     Component Value Date/Time   CHOL 128 07/30/2010 0929   TRIG 102 07/30/2010 0929   HDL 42 07/30/2010 0929   CHOLHDL 3.0 07/30/2010 0929   VLDL 20 07/30/2010 0929   LDLCALC 66 07/30/2010 0929   HEMOGLOBIN A1C No results found for: HGBA1C, MPG TSH No results for input(s): TSH in the last 8760 hours.  External labs:   Glucose Random 146.000 M 07/18/2019  BUN 30.000 M 07/18/2019 Creatinine, Serum 1.930 MG/ 07/18/2019  Lipid Panel  05/02/2019 Cholesterol, total 158.000 m 05/02/2019 Triglycerides 173.000 05/02/2019 HDL 34 MG/DL 05/02/2019 LDL 89.000 mg 05/02/2019  A1C 6.700 %  05/02/2019  MicroAlbumin Urine 5.000 05/02/2019 MicroAlbumin/Creat 5.1 MG/DL 05/02/2019  TSH 3.240 micr 05/02/2019  PSA 0.679 ng/ 05/02/2019  11/09/2018: CBC normal, serum glucose 166 minute grams, serum creatinine 1.24, eGFR 60 mL.  04/07/2017: Serum glucose 165 mg, BUN 23, creatinine 1.2, eGFR 60 ML, potassium 4.2. CBC normal.  Total cholesterol 138, triglycerides 145, HDL 32, LDL 77. Non-HDL cholesterol 106. Apo B normal at 82, TSH normal, PSA normal.   Medications and allergies   Allergies  Allergen Reactions  . Amlodipine Other (See Comments)    Edema  . Hydralazine Other (See Comments)    Chest pain and weakness  . Zocor [Simvastatin - High Dose] Other (See Comments)    myalgias     Current Outpatient Medications  Medication Instructions  . aspirin EC 81 mg, Oral, Daily  . cholecalciferol (VITAMIN D3) 1,000 Units, Oral, Daily  . Coenzyme Q10 (COQ10) 100 MG CAPS Oral  . fluticasone (FLONASE) 50 MCG/ACT nasal spray 2 sprays, Each Nare, Daily  . iron polysaccharides (NIFEREX) 150 mg, Oral, Daily  . labetalol (NORMODYNE) 100 MG tablet TAKE 1 TABLET BY MOUTH TWICE DAILY  . metFORMIN (GLUCOPHAGE) 1,000 mg, Oral, 2 times daily with meals  . multivitamin-lutein (OCUVITE-LUTEIN) CAPS capsule 1 capsule, Oral, Daily  . olmesartan-hydrochlorothiazide (BENICAR HCT) 20-12.5 MG tablet 1 tablet, Oral, Daily  . Omega-3 Fatty Acids (FISH OIL) 1000 MG CAPS Oral  . omeprazole (PRILOSEC) 40 mg, Oral, Daily  . rosuvastatin (CRESTOR) 10 MG tablet take 1 tablet by mouth once daily  . vitamin B-12 (CYANOCOBALAMIN) 1,000 mcg, Oral, Daily  . vitamin C (ASCORBIC ACID) 500 mg, Oral, Daily  . vitamin E 100 UNIT capsule Oral, Daily   Radiology:   Chest x-ray PA and lateral view normal.  Ultrasound of the abdomen 07/10/2019 revealed bilateral mild cystic disease of the kidney, findings suggestive of polycystic renal disease on cystic renal disease in the setting of renal failure.  No definite  hydronephrosis bilaterally.  US Abdominal Aorta 01/06/2014: No aortic aneurysm. Abdominal aorta measures 2.3 x 2.5 cm in diameter.  Cardiac Studies:   Lexiscan Myoview stress test 11/10/2018 [facility]):  No evidence of ischemia, normal LVEF.  Low risk study.  Echocardiogram 06/18/2019:  Normal LV systolic function with EF 66%. Left ventricle cavity is normal in size. Normal global wall motion. Normal diastolic filling pattern.  Calculated EF 66%.  Left atrial cavity is mildly dilated at 4.0 cm and 51 mL. Normal interatrial septum.  Structurally normal tricuspid valve. Mild tricuspid regurgitation. No evidence of pulmonary hypertension.  Structurally normal pulmonic valve. Mild pulmonic regurgitation.  EKG    06/17/2019: Sinus bradycardia at 57 bpm, normal axis, no  evidence of ischemia.   Assessment     ICD-10-CM   1. Essential hypertension  I10   2. Bradycardia  R00.1      No orders of the defined types were placed in this encounter.   Medications Discontinued During This Encounter  Medication Reason  . aspirin 325 MG tablet Change in therapy  . cefdinir (OMNICEF) 300 MG capsule Completed Course  . BIOGAIA PROBIOTIC (BIOGAIA PROBIOTIC) LIQD Patient Preference  . olmesartan-hydrochlorothiazide (BENICAR HCT) 40-25 MG tablet Change in therapy  . NON FORMULARY Patient Preference  . TURMERIC PO Patient Preference    Recommendations:   Joseph Nguyen  is a 69 y.o. male  with hypertension, hyperlipidemia, and controlled diabetes. He also has h/o asymptomatic multiple cysts in the kidney that he states has slightly progressed and noted to have mild proteinuria by Dr. Marval Regal.  He has had marked fluctuation in blood pressure and states that since being on present medications, blood pressure and heart rate has been very stable and he is presently asymptomatic.  Blood pressure is well controlled with current medications and appears to be much more stable. Heart rate  has also been stable and improved. Will continue with Labetalol and Benicar HCT. He is followed by Nephrology for management of polycystic kidney disease that patient states has slightly progressed. I will defer to Nephrology if he can continue with Benicar HCT. I have asked patient to contact me if further adjustments need to be made.    With regard to hyperlipidemia, he still has mild hypertriglyceridemia and LDL is not at goal but patient has multiple medication intolerances and presently on moderate dose of high intensity Crestor at 10 mg daily, continue the same.  Otherwise stable from cardiac standpoint, I will see him back in a year.  Adrian Prows, MD, [facility] 10/04/2019, 5:42 AM [facility]. [place] Pager: [phone]  Office: [phone]  CC Dr. Marval Regal; Dr. Lutricia Feil

## 2019-10-03 ENCOUNTER — Ambulatory Visit: Payer: Medicare Other | Admitting: Cardiology

## 2019-10-03 ENCOUNTER — Other Ambulatory Visit: Payer: Self-pay

## 2019-10-03 ENCOUNTER — Encounter: Payer: Self-pay | Admitting: Cardiology

## 2019-10-03 VITALS — BP 130/68 | HR 54 | Temp 97.2°F | Resp 16 | Ht 71.0 in | Wt 200.8 lb

## 2019-10-03 DIAGNOSIS — E782 Mixed hyperlipidemia: Secondary | ICD-10-CM | POA: Diagnosis not present

## 2019-10-03 DIAGNOSIS — Q613 Polycystic kidney, unspecified: Secondary | ICD-10-CM

## 2019-10-03 DIAGNOSIS — R001 Bradycardia, unspecified: Secondary | ICD-10-CM | POA: Diagnosis not present

## 2019-10-03 DIAGNOSIS — I10 Essential (primary) hypertension: Secondary | ICD-10-CM

## 2019-10-18 DIAGNOSIS — E1122 Type 2 diabetes mellitus with diabetic chronic kidney disease: Secondary | ICD-10-CM | POA: Diagnosis not present

## 2019-12-19 ENCOUNTER — Other Ambulatory Visit: Payer: Self-pay | Admitting: Cardiology

## 2020-01-07 DIAGNOSIS — D638 Anemia in other chronic diseases classified elsewhere: Secondary | ICD-10-CM | POA: Diagnosis not present

## 2020-01-07 DIAGNOSIS — Q612 Polycystic kidney, adult type: Secondary | ICD-10-CM | POA: Diagnosis not present

## 2020-01-07 DIAGNOSIS — N183 Chronic kidney disease, stage 3 unspecified: Secondary | ICD-10-CM | POA: Diagnosis not present

## 2020-01-07 DIAGNOSIS — E785 Hyperlipidemia, unspecified: Secondary | ICD-10-CM | POA: Diagnosis not present

## 2020-01-07 DIAGNOSIS — N2581 Secondary hyperparathyroidism of renal origin: Secondary | ICD-10-CM | POA: Diagnosis not present

## 2020-01-07 DIAGNOSIS — I129 Hypertensive chronic kidney disease with stage 1 through stage 4 chronic kidney disease, or unspecified chronic kidney disease: Secondary | ICD-10-CM | POA: Diagnosis not present

## 2020-01-07 DIAGNOSIS — E1122 Type 2 diabetes mellitus with diabetic chronic kidney disease: Secondary | ICD-10-CM | POA: Diagnosis not present

## 2020-02-07 DIAGNOSIS — Z23 Encounter for immunization: Secondary | ICD-10-CM | POA: Diagnosis not present

## 2020-02-12 DIAGNOSIS — D649 Anemia, unspecified: Secondary | ICD-10-CM | POA: Diagnosis not present

## 2020-02-12 DIAGNOSIS — N1832 Chronic kidney disease, stage 3b: Secondary | ICD-10-CM | POA: Diagnosis not present

## 2020-02-12 DIAGNOSIS — E538 Deficiency of other specified B group vitamins: Secondary | ICD-10-CM | POA: Diagnosis not present

## 2020-02-29 DIAGNOSIS — Z23 Encounter for immunization: Secondary | ICD-10-CM | POA: Diagnosis not present

## 2020-03-16 DIAGNOSIS — Z03818 Encounter for observation for suspected exposure to other biological agents ruled out: Secondary | ICD-10-CM | POA: Diagnosis not present

## 2020-03-16 DIAGNOSIS — Z20822 Contact with and (suspected) exposure to covid-19: Secondary | ICD-10-CM | POA: Diagnosis not present

## 2020-04-26 ENCOUNTER — Other Ambulatory Visit: Payer: Self-pay | Admitting: Cardiology

## 2020-05-04 DIAGNOSIS — E1122 Type 2 diabetes mellitus with diabetic chronic kidney disease: Secondary | ICD-10-CM | POA: Diagnosis not present

## 2020-05-04 DIAGNOSIS — N183 Chronic kidney disease, stage 3 unspecified: Secondary | ICD-10-CM | POA: Diagnosis not present

## 2020-05-04 DIAGNOSIS — E669 Obesity, unspecified: Secondary | ICD-10-CM | POA: Diagnosis not present

## 2020-05-04 DIAGNOSIS — D638 Anemia in other chronic diseases classified elsewhere: Secondary | ICD-10-CM | POA: Diagnosis not present

## 2020-05-04 DIAGNOSIS — N2581 Secondary hyperparathyroidism of renal origin: Secondary | ICD-10-CM | POA: Diagnosis not present

## 2020-05-04 DIAGNOSIS — Q612 Polycystic kidney, adult type: Secondary | ICD-10-CM | POA: Diagnosis not present

## 2020-05-04 DIAGNOSIS — I129 Hypertensive chronic kidney disease with stage 1 through stage 4 chronic kidney disease, or unspecified chronic kidney disease: Secondary | ICD-10-CM | POA: Diagnosis not present

## 2020-06-03 DIAGNOSIS — E1129 Type 2 diabetes mellitus with other diabetic kidney complication: Secondary | ICD-10-CM | POA: Diagnosis not present

## 2020-06-03 DIAGNOSIS — E785 Hyperlipidemia, unspecified: Secondary | ICD-10-CM | POA: Diagnosis not present

## 2020-06-03 DIAGNOSIS — Z125 Encounter for screening for malignant neoplasm of prostate: Secondary | ICD-10-CM | POA: Diagnosis not present

## 2020-06-09 DIAGNOSIS — E1129 Type 2 diabetes mellitus with other diabetic kidney complication: Secondary | ICD-10-CM | POA: Diagnosis not present

## 2020-06-09 DIAGNOSIS — N1832 Chronic kidney disease, stage 3b: Secondary | ICD-10-CM | POA: Diagnosis not present

## 2020-06-09 DIAGNOSIS — D649 Anemia, unspecified: Secondary | ICD-10-CM | POA: Diagnosis not present

## 2020-06-09 DIAGNOSIS — Z0000 Encounter for general adult medical examination without abnormal findings: Secondary | ICD-10-CM | POA: Diagnosis not present

## 2020-06-09 DIAGNOSIS — E785 Hyperlipidemia, unspecified: Secondary | ICD-10-CM | POA: Diagnosis not present

## 2020-06-09 DIAGNOSIS — R82998 Other abnormal findings in urine: Secondary | ICD-10-CM | POA: Diagnosis not present

## 2020-06-09 DIAGNOSIS — N529 Male erectile dysfunction, unspecified: Secondary | ICD-10-CM | POA: Diagnosis not present

## 2020-06-09 DIAGNOSIS — Z1331 Encounter for screening for depression: Secondary | ICD-10-CM | POA: Diagnosis not present

## 2020-06-09 DIAGNOSIS — E669 Obesity, unspecified: Secondary | ICD-10-CM | POA: Diagnosis not present

## 2020-06-09 DIAGNOSIS — Q612 Polycystic kidney, adult type: Secondary | ICD-10-CM | POA: Diagnosis not present

## 2020-06-09 DIAGNOSIS — I129 Hypertensive chronic kidney disease with stage 1 through stage 4 chronic kidney disease, or unspecified chronic kidney disease: Secondary | ICD-10-CM | POA: Diagnosis not present

## 2020-06-09 DIAGNOSIS — M25511 Pain in right shoulder: Secondary | ICD-10-CM | POA: Diagnosis not present

## 2020-06-09 DIAGNOSIS — E538 Deficiency of other specified B group vitamins: Secondary | ICD-10-CM | POA: Diagnosis not present

## 2020-06-10 DIAGNOSIS — Z1212 Encounter for screening for malignant neoplasm of rectum: Secondary | ICD-10-CM | POA: Diagnosis not present

## 2020-06-11 DIAGNOSIS — H52203 Unspecified astigmatism, bilateral: Secondary | ICD-10-CM | POA: Diagnosis not present

## 2020-06-11 DIAGNOSIS — H43811 Vitreous degeneration, right eye: Secondary | ICD-10-CM | POA: Diagnosis not present

## 2020-06-11 DIAGNOSIS — H2513 Age-related nuclear cataract, bilateral: Secondary | ICD-10-CM | POA: Diagnosis not present

## 2020-06-11 DIAGNOSIS — H524 Presbyopia: Secondary | ICD-10-CM | POA: Diagnosis not present

## 2020-06-16 DIAGNOSIS — Z86010 Personal history of colonic polyps: Secondary | ICD-10-CM | POA: Diagnosis not present

## 2020-06-16 DIAGNOSIS — K219 Gastro-esophageal reflux disease without esophagitis: Secondary | ICD-10-CM | POA: Diagnosis not present

## 2020-07-08 DIAGNOSIS — N1832 Chronic kidney disease, stage 3b: Secondary | ICD-10-CM | POA: Diagnosis not present

## 2020-07-08 DIAGNOSIS — I10 Essential (primary) hypertension: Secondary | ICD-10-CM | POA: Diagnosis not present

## 2020-07-08 DIAGNOSIS — E785 Hyperlipidemia, unspecified: Secondary | ICD-10-CM | POA: Diagnosis not present

## 2020-07-08 DIAGNOSIS — I129 Hypertensive chronic kidney disease with stage 1 through stage 4 chronic kidney disease, or unspecified chronic kidney disease: Secondary | ICD-10-CM | POA: Diagnosis not present

## 2020-07-08 DIAGNOSIS — E1129 Type 2 diabetes mellitus with other diabetic kidney complication: Secondary | ICD-10-CM | POA: Diagnosis not present

## 2020-07-15 DIAGNOSIS — L821 Other seborrheic keratosis: Secondary | ICD-10-CM | POA: Diagnosis not present

## 2020-07-15 DIAGNOSIS — Z85828 Personal history of other malignant neoplasm of skin: Secondary | ICD-10-CM | POA: Diagnosis not present

## 2020-07-15 DIAGNOSIS — D692 Other nonthrombocytopenic purpura: Secondary | ICD-10-CM | POA: Diagnosis not present

## 2020-07-26 ENCOUNTER — Emergency Department (HOSPITAL_COMMUNITY): Payer: Medicare Other

## 2020-07-26 ENCOUNTER — Encounter (HOSPITAL_COMMUNITY): Payer: Self-pay | Admitting: Emergency Medicine

## 2020-07-26 ENCOUNTER — Other Ambulatory Visit: Payer: Self-pay

## 2020-07-26 ENCOUNTER — Emergency Department (HOSPITAL_COMMUNITY)
Admission: EM | Admit: 2020-07-26 | Discharge: 2020-07-26 | Disposition: A | Discharge status: Home / Self Care | Payer: Medicare Other | Attending: Emergency Medicine | Admitting: Emergency Medicine

## 2020-07-26 DIAGNOSIS — E1122 Type 2 diabetes mellitus with diabetic chronic kidney disease: Secondary | ICD-10-CM | POA: Insufficient documentation

## 2020-07-26 DIAGNOSIS — I129 Hypertensive chronic kidney disease with stage 1 through stage 4 chronic kidney disease, or unspecified chronic kidney disease: Secondary | ICD-10-CM | POA: Insufficient documentation

## 2020-07-26 DIAGNOSIS — E1165 Type 2 diabetes mellitus with hyperglycemia: Secondary | ICD-10-CM | POA: Insufficient documentation

## 2020-07-26 DIAGNOSIS — Z79899 Other long term (current) drug therapy: Secondary | ICD-10-CM | POA: Diagnosis not present

## 2020-07-26 DIAGNOSIS — R519 Headache, unspecified: Secondary | ICD-10-CM | POA: Diagnosis not present

## 2020-07-26 DIAGNOSIS — N189 Chronic kidney disease, unspecified: Secondary | ICD-10-CM | POA: Insufficient documentation

## 2020-07-26 DIAGNOSIS — Z87891 Personal history of nicotine dependence: Secondary | ICD-10-CM | POA: Insufficient documentation

## 2020-07-26 DIAGNOSIS — Z7984 Long term (current) use of oral hypoglycemic drugs: Secondary | ICD-10-CM | POA: Diagnosis not present

## 2020-07-26 DIAGNOSIS — M545 Low back pain, unspecified: Secondary | ICD-10-CM | POA: Diagnosis not present

## 2020-07-26 DIAGNOSIS — R739 Hyperglycemia, unspecified: Secondary | ICD-10-CM

## 2020-07-26 DIAGNOSIS — Z7982 Long term (current) use of aspirin: Secondary | ICD-10-CM | POA: Diagnosis not present

## 2020-07-26 DIAGNOSIS — R5383 Other fatigue: Secondary | ICD-10-CM | POA: Diagnosis not present

## 2020-07-26 DIAGNOSIS — R0602 Shortness of breath: Secondary | ICD-10-CM | POA: Diagnosis not present

## 2020-07-26 LAB — CBC
HCT: 38.1 % — ABNORMAL LOW (ref 39.0–52.0)
Hemoglobin: 12.3 g/dL — ABNORMAL LOW (ref 13.0–17.0)
MCH: 28.3 pg (ref 26.0–34.0)
MCHC: 32.3 g/dL (ref 30.0–36.0)
MCV: 87.8 fL (ref 80.0–100.0)
Platelets: 170 10*3/uL (ref 150–400)
RBC: 4.34 MIL/uL (ref 4.22–5.81)
RDW: 15.8 % — ABNORMAL HIGH (ref 11.5–15.5)
WBC: 5.7 10*3/uL (ref 4.0–10.5)
nRBC: 0 % (ref 0.0–0.2)

## 2020-07-26 LAB — URINALYSIS, ROUTINE W REFLEX MICROSCOPIC
Bacteria, UA: NONE SEEN
Bilirubin Urine: NEGATIVE
Glucose, UA: >=500 mg/dL — AB
Hgb urine dipstick: NEGATIVE
Ketones, ur: NEGATIVE mg/dL
Leukocytes,Ua: NEGATIVE
Nitrite: NEGATIVE
Protein, ur: NEGATIVE mg/dL
Specific Gravity, Urine: 1.01 (ref 1.005–1.030)
pH: 5 (ref 5.0–8.0)

## 2020-07-26 LAB — COMPREHENSIVE METABOLIC PANEL
ALT: 18 U/L (ref 0–44)
AST: 22 U/L (ref 15–41)
Albumin: 3.8 g/dL (ref 3.5–5.0)
Alkaline Phosphatase: 67 U/L (ref 38–126)
Anion gap: 10 (ref 5–15)
BUN: 34 mg/dL — ABNORMAL HIGH (ref 8–23)
CO2: 20 mmol/L — ABNORMAL LOW (ref 22–32)
Calcium: 9.1 mg/dL (ref 8.9–10.3)
Chloride: 106 mmol/L (ref 98–111)
Creatinine, Ser: 2.47 mg/dL — ABNORMAL HIGH (ref 0.61–1.24)
GFR, Estimated: 27 mL/min — ABNORMAL LOW (ref >60)
Glucose, Bld: 222 mg/dL — ABNORMAL HIGH (ref 70–99)
Potassium: 4.4 mmol/L (ref 3.5–5.1)
Sodium: 136 mmol/L (ref 135–145)
Total Bilirubin: 0.9 mg/dL (ref 0.3–1.2)
Total Protein: 6.9 g/dL (ref 6.5–8.1)

## 2020-07-26 LAB — CBG MONITORING, ED: Glucose-Capillary: 218 mg/dL — ABNORMAL HIGH (ref 70–99)

## 2020-07-26 MED ORDER — SODIUM CHLORIDE 0.9% FLUSH
3.0000 mL | Freq: Once | INTRAVENOUS | Status: AC
  Administered 2020-07-26: 3 mL via INTRAVENOUS

## 2020-07-26 MED ORDER — SODIUM CHLORIDE 0.9 % IV BOLUS
1000.0000 mL | Freq: Once | INTRAVENOUS | Status: AC
  Administered 2020-07-26: 1000 mL via INTRAVENOUS

## 2020-07-26 NOTE — ED Triage Notes (Signed)
C/o fatigue and lower back pain x 4-5 days.  States blood sugars have been running higher in the 180s.

## 2020-07-26 NOTE — ED Notes (Signed)
Patient transported to CT 

## 2020-07-26 NOTE — ED Notes (Signed)
Pt resting in bed states pain in back started a couple of days ago, denies trauma to area Pain is across lower back, denies hx of similar pain

## 2020-07-26 NOTE — Discharge Instructions (Signed)
You will need to increase your hydration. Continue your home medications as you were previously prescribed. You will need to follow up with your nephrologist and PCP for any adjustments to your medications. Monitor your blood sugar at home. Return to the ED if you start to experience worsening symptoms, chest pain, shortness of breath, dizziness. I have entered an ambulatory referral to the nephrologist in hopes that they can see you in the clinic sooner.

## 2020-07-26 NOTE — ED Provider Notes (Signed)
[facility]ERGENCY DEPARTMENT Provider Note   CSN: [id number] Arrival date & time: 07/26/20  1110     History Chief Complaint  Patient presents with  . Fatigue  . Back Pain    [name] is a 70 y.o. male with a past medical history of diabetes, hypertension, PKD presenting to the ED with a chief complaint of fatigue, nausea, back pain, shortness of breath, headache and decreased appetite.  States that 3 weeks ago he had medication changes.  He was started on Jardiance and ?ozempic after being switched from Metformin.  States that for the past week he has been experiencing fatigue, shortness of breath with walking short distances in his home that worsened in the past 2 days.  Reports nausea but denies any vomiting since yesterday.  States that his sugars have been running high in the upper 100s.  Reports headache for the past 3 weeks and does not typically get headaches.  No vision changes, numbness in arms or legs, injuries or falls.  No neck stiffness.  He is unsure if this is related to his recent medication changes.  Reports polydipsia but denies any polyuria, dysuria or fever.  HPI     Past Medical History:  Diagnosis Date  . Controlled type 2 diabetes mellitus without complication ([name]) 9/37/9024  . DM (diabetes mellitus) ([name])   . Essential hypertension 07/27/2011  . Hiatal hernia   . Hypercholesterolemia   . Kidney disease    Polycystic,   . Mixed hyperlipidemia 11/05/2018  . Obesity   . Polycystic kidney disease 11/05/2018    Patient Active Problem List   Diagnosis Date Noted  . Polycystic kidney disease 11/05/2018  . Mixed hyperlipidemia 11/05/2018  . Controlled type 2 diabetes mellitus without complication ([name]) 09/73/5329  . Essential hypertension 07/27/2011    Past Surgical History:  Procedure Laterality Date  . CARDIOVASCULAR STRESS TEST  10-18-2005   EF 79%  . TUMOR REMOVAL  1968   Left Breast Benign       Family History  Problem  Relation Age of Onset  . Cancer Mother     Social History   Tobacco Use  . Smoking status: Former Smoker    Packs/day: 0.25    Years: 5.00    Pack years: 1.25    Types: Cigarettes    Quit date: 02/25/1979    Years since quitting: 41.4  . Smokeless tobacco: Never Used  . Tobacco comment: Quite 2yrs+ ago  Vaping Use  . Vaping Use: Never used  Substance Use Topics  . Alcohol use: Yes    Comment: occasionally  . Drug use: Never    Home Medications Prior to Admission medications   Medication Sig Start Date End Date Taking? Authorizing Provider  acetaminophen (TYLENOL) 325 MG tablet Take 325-650 mg by mouth every 8 (eight) hours as needed for mild pain (or headaches).   Yes [provider]  aspirin EC 81 MG tablet Take 81 mg by mouth daily.   Yes [provider]  cholecalciferol (VITAMIN D3) 25 MCG (1000 UT) tablet Take 1,000 Units by mouth daily.   Yes [provider]  Coenzyme Q10 (COQ10) 100 MG CAPS Take 100 mg by mouth daily.   Yes [provider]  empagliflozin (JARDIANCE) 10 MG TABS tablet Take 10 mg by mouth daily.   Yes [provider]  fluticasone (FLONASE) 50 MCG/ACT nasal spray Place 2 sprays into both nostrils daily as needed for allergies or rhinitis. 06/26/19  Yes [provider]  labetalol (NORMODYNE) 100 MG tablet TAKE 1 TABLET BY MOUTH TWICE DAILY Patient taking differently: Take 100 mg by mouth in the morning and at bedtime. 04/27/20  Yes Adrian Prows, MD  multivitamin-lutein [facility]) CAPS capsule Take 1 capsule by mouth daily.   Yes [provider]  olmesartan-hydrochlorothiazide (BENICAR HCT) 20-12.5 MG tablet Take 1 tablet by mouth daily.   Yes [provider]  Omega-3 Fatty Acids (FISH OIL) 1000 MG CAPS Take 1,000 mg by mouth daily.   Yes [provider]  omeprazole (PRILOSEC) 40 MG capsule Take 40 mg by mouth daily before breakfast.   Yes [provider]  POLY-IRON 150  150 MG capsule Take 150 mg by mouth daily with breakfast.   Yes [provider]  rosuvastatin (CRESTOR) 10 MG tablet take 1 tablet by mouth once daily Patient taking differently: Take 10 mg by mouth at bedtime. 05/21/14  Yes [name], [name], MD  Semaglutide,0.25 or 0.5MG /DOS, (OZEMPIC, 0.25 OR 0.5 MG/DOSE,) 2 MG/1.5ML SOPN Inject 0.5 mg into the skin every Wednesday.   Yes [provider]  vitamin B-12 (CYANOCOBALAMIN) 1000 MCG tablet Take 1,000 mcg by mouth daily.   Yes [provider]  vitamin C (ASCORBIC ACID) 500 MG tablet Take 500 mg by mouth daily.   Yes [provider]  vitamin E 100 UNIT capsule Take 100 Units by mouth daily.   Yes [provider]    Allergies    Amlodipine, Hydralazine, Jardiance [empagliflozin], and Zocor [simvastatin - high dose]  Review of Systems   Review of Systems  Constitutional: Positive for activity change, appetite change and fatigue. Negative for chills and fever.  HENT: Negative for ear pain, rhinorrhea, sneezing and sore throat.   Eyes: Negative for photophobia and visual disturbance.  Respiratory: Positive for shortness of breath. Negative for cough, chest tightness and wheezing.   Cardiovascular: Negative for chest pain and palpitations.  Gastrointestinal: Positive for nausea. Negative for abdominal pain, blood in stool, constipation, diarrhea and vomiting.  Genitourinary: Negative for dysuria, hematuria and urgency.  Musculoskeletal: Positive for myalgias.  Skin: Negative for rash.  Neurological: Positive for headaches. Negative for dizziness, weakness and light-headedness.    Physical Exam Updated Vital Signs BP 106/62   Pulse 66   Temp (!) 97.3 F (36.3 C) (Oral)   Resp 18   SpO2 96%   Physical Exam Vitals and nursing note reviewed.  Constitutional:      General: He is not in acute distress.    Appearance: He is well-developed and well-nourished.     Comments: Speaking complete sentences  without difficulty.  HENT:     Head: Normocephalic and atraumatic.     Nose: Nose normal.  Eyes:     General: No scleral icterus.       Right eye: No discharge.        Left eye: No discharge.     Extraocular Movements: EOM normal.     Conjunctiva/sclera: Conjunctivae normal.     Pupils: Pupils are equal, round, and reactive to light.  Cardiovascular:     Rate and Rhythm: Normal rate and regular rhythm.     Pulses: Intact distal pulses.     Heart sounds: Normal heart sounds. No murmur heard. No friction rub. No gallop.   Pulmonary:     Effort: Pulmonary effort is normal. No respiratory distress.     Breath sounds: Normal breath sounds.  Abdominal:     General: Bowel sounds are  normal. There is no distension.     Palpations: Abdomen is soft.     Tenderness: There is no abdominal tenderness. There is no guarding.  Musculoskeletal:        General: No edema. Normal range of motion.     Cervical back: Normal range of motion and neck supple.  Skin:    General: Skin is warm and dry.     Findings: No rash.  Neurological:     Mental Status: He is alert and oriented to person, place, and time.     Cranial Nerves: No cranial nerve deficit.     Sensory: No sensory deficit.     Motor: No weakness or abnormal muscle tone.     Coordination: Coordination normal.     Comments: Pupils reactive. No facial asymmetry noted. Cranial nerves appear grossly intact. Sensation intact to light touch on face, BUE and BLE. Strength 5/5 in BUE and BLE.   Psychiatric:        Mood and Affect: Mood and affect normal.     ED Results / Procedures / Treatments   Labs (all labs ordered are listed, but only abnormal results are displayed) Labs Reviewed  CBC - Abnormal; Notable for the following components:      Result Value   Hemoglobin 12.3 (*)    HCT 38.1 (*)    RDW 15.8 (*)    All other components within normal limits  URINALYSIS, ROUTINE W REFLEX MICROSCOPIC - Abnormal; Notable for the following  components:   Glucose, UA >=500 (*)    All other components within normal limits  COMPREHENSIVE METABOLIC PANEL - Abnormal; Notable for the following components:   CO2 20 (*)    Glucose, Bld 222 (*)    BUN 34 (*)    Creatinine, Ser 2.47 (*)    GFR, Estimated 27 (*)    All other components within normal limits  CBG MONITORING, ED - Abnormal; Notable for the following components:   Glucose-Capillary 218 (*)    All other components within normal limits  URINE CULTURE    EKG EKG Interpretation  Date/Time:  Sunday July 26 2020 11:18:34 EST Ventricular Rate:  64 PR Interval:  172 QRS Duration: 102 QT Interval:  416 QTC Calculation: 429 R Axis:   79 Text Interpretation: Normal sinus rhythm Incomplete right bundle branch block Borderline ECG When compared to prior, similar apperance. No STEMI Confirmed by Antony Blackbird [phone]) on 07/26/2020 11:25:17 AM   Radiology DG Chest 2 View  Result Date: 07/26/2020 CLINICAL DATA:  Fatigue and low back pain for 4-5 days. EXAM: CHEST - 2 VIEW COMPARISON:  None. FINDINGS: Lungs clear. Heart size normal. Aortic atherosclerosis. No pneumothorax or pleural fluid. Eventration right hemidiaphragm noted. IMPRESSION: No acute disease. Aortic Atherosclerosis (ICD10-I70.0). Electronically Signed   By: Inge Rise M.D.   On: 07/26/2020 13:09   CT Head Wo Contrast  Result Date: 07/26/2020 CLINICAL DATA:  Headache. EXAM: CT HEAD WITHOUT CONTRAST TECHNIQUE: Contiguous axial images were obtained from the base of the skull through the vertex without intravenous contrast. COMPARISON:  None. FINDINGS: Brain: No evidence of acute infarction, hemorrhage, hydrocephalus, extra-axial collection or mass lesion/mass effect. Vascular: No hyperdense vessel or unexpected calcification. Skull: Normal. Negative for fracture or focal lesion. Sinuses/Orbits: Negative. Other: None. IMPRESSION: Normal head CT. Electronically Signed   By: Inge Rise M.D.   On: 07/26/2020 13:53     Procedures Procedures   Medications Ordered in ED Medications  sodium chloride flush (NS) 0.9 %  injection 3 mL (3 mLs Intravenous Given 07/26/20 1141)  sodium chloride 0.9 % bolus 1,000 mL (0 mLs Intravenous Stopped 07/26/20 1250)    ED Course  I have reviewed the triage vital signs and the nursing notes.  Pertinent labs & imaging results that were available during my care of the patient were reviewed by me and considered in my medical decision making (see chart for details).  Clinical Course as of 07/26/20 1603  Sun Jul 26, 2020  1140 Glucose-Capillary(!): 218 [HK]  1401 Creatinine(!): 2.47 No recent values for comparison although I do see appointment with nephrology in the past as well as notes of stage III CKD. [HK]  1402 Glucose, UA(!): >=500 [HK]  1419 Ketones, ur: NEGATIVE [HK]  1419 CO2(!): 20 [HK]    Clinical Course User Index [HK] Delia Heady, PA-C   MDM Rules/Calculators/A&P                          70 year old male presenting to the ED with a chief complaint of fatigue, shortness of breath, back pain, nausea and headache.  Had changes to his diabetes medications about 3 weeks ago and he is unsure if this is caused by the new medications.  States that headache has been persistent for about 3 weeks but does not typically get headaches.  Denies any chest pain but does report shortness of breath even with walking short distances.  No injuries or falls.  No vision changes, numbness in arms or legs.  Patient without any neurological deficits on exam.  No signs of respiratory distress noted.  No lower extremity edema, erythema or calf tenderness bilaterally.  EKG shows normal sinus rhythm, no changes from prior tracings, no STEMI.  CBG is 218 here.  Will obtain remainder of lab work as well as CT of the head, chest x-ray and reassess after IV fluids.  Work-up here shows glucose in the 200s.  He has a creatinine of 2.4 but has a known history of CKD seen on chart review.  CBC  unremarkable, hemoglobin around baseline.  Urinalysis with greater than 500 glucose and no evidence of ketones or infection.  CT of the head without any abnormalities.  Chest x-ray is unremarkable. Patient and wife frustrated that they do not feel that his new diabetes medications are helping with his hyperglycemia.  They feel that he is experiencing more side effects than benefit from the symptoms.  They state that they cannot see the nephrologist until May and when they told their primary care provider that they do not feel the medications are helping, they were told to continue it.  I explained to them that we are unable to adjust these medications in the ER and that they will need to follow-up with PCP regarding this.  Informed them that they should not discontinue these medications until they are cleared to do so by their PCP who prescribed them. I understand their frustration but I informed them from an ER standpoint his workup is reassuring. I do feel his Cr is chronically elevated, last labs seen on Epic shows Cr of 1.9 about one year ago.  I have given an ambulatory referral to nephrology. Encouraged to continue hydration and followup. Return precautions given.   Patient is hemodynamically stable, in NAD, and able to ambulate in the ED. Evaluation does not show pathology that would require ongoing emergent intervention or inpatient treatment. I explained the diagnosis to the patient. Pain has been managed and  has no complaints prior to discharge. Patient is comfortable with above plan and is stable for discharge at this time. All questions were answered prior to disposition. Strict return precautions for returning to the ED were discussed. Encouraged follow up with PCP.   An After Visit Summary was printed and given to the patient.   Portions of this note were generated with [employer]. Dictation errors may occur despite best attempts at proofreading.  Final Clinical  Impression(s) / ED Diagnoses Final diagnoses:  Hyperglycemia  Chronic kidney disease, unspecified CKD stage    Rx / DC Orders ED Discharge Orders         Ordered    Ambulatory referral to Nephrology        07/26/20 1600           Delia Heady, [place] 07/26/20 1603    Tegeler, Gwenyth Allegra, MD 07/27/20 1625

## 2020-07-27 DIAGNOSIS — E1129 Type 2 diabetes mellitus with other diabetic kidney complication: Secondary | ICD-10-CM | POA: Diagnosis not present

## 2020-07-27 DIAGNOSIS — I959 Hypotension, unspecified: Secondary | ICD-10-CM | POA: Diagnosis not present

## 2020-07-27 DIAGNOSIS — N1832 Chronic kidney disease, stage 3b: Secondary | ICD-10-CM | POA: Diagnosis not present

## 2020-07-27 DIAGNOSIS — I129 Hypertensive chronic kidney disease with stage 1 through stage 4 chronic kidney disease, or unspecified chronic kidney disease: Secondary | ICD-10-CM | POA: Diagnosis not present

## 2020-07-27 DIAGNOSIS — N179 Acute kidney failure, unspecified: Secondary | ICD-10-CM | POA: Diagnosis not present

## 2020-07-27 LAB — URINE CULTURE: Culture: NO GROWTH

## 2020-07-28 DIAGNOSIS — Z1152 Encounter for screening for COVID-19: Secondary | ICD-10-CM | POA: Diagnosis not present

## 2020-07-28 DIAGNOSIS — E1129 Type 2 diabetes mellitus with other diabetic kidney complication: Secondary | ICD-10-CM | POA: Diagnosis not present

## 2020-07-28 DIAGNOSIS — N1832 Chronic kidney disease, stage 3b: Secondary | ICD-10-CM | POA: Diagnosis not present

## 2020-07-28 DIAGNOSIS — I129 Hypertensive chronic kidney disease with stage 1 through stage 4 chronic kidney disease, or unspecified chronic kidney disease: Secondary | ICD-10-CM | POA: Diagnosis not present

## 2020-07-28 DIAGNOSIS — R509 Fever, unspecified: Secondary | ICD-10-CM | POA: Diagnosis not present

## 2020-07-30 ENCOUNTER — Telehealth: Payer: Self-pay

## 2020-07-30 DIAGNOSIS — Z1152 Encounter for screening for COVID-19: Secondary | ICD-10-CM | POA: Diagnosis not present

## 2020-07-30 NOTE — Telephone Encounter (Signed)
141 /61 pulse 61 115/59 pulse 69 Should he continue labetalol ?  He feels very heavy, arms are very weak.  meds were changed recently for his diabetes. Recent ER visit.

## 2020-07-31 NOTE — Telephone Encounter (Signed)
As discussed, reduce labetalol to 100 mg 1/2 BID

## 2020-08-04 DIAGNOSIS — N183 Chronic kidney disease, stage 3 unspecified: Secondary | ICD-10-CM | POA: Diagnosis not present

## 2020-08-04 DIAGNOSIS — I10 Essential (primary) hypertension: Secondary | ICD-10-CM | POA: Diagnosis not present

## 2020-08-10 DIAGNOSIS — N183 Chronic kidney disease, stage 3 unspecified: Secondary | ICD-10-CM | POA: Diagnosis not present

## 2020-08-10 DIAGNOSIS — Q612 Polycystic kidney, adult type: Secondary | ICD-10-CM | POA: Diagnosis not present

## 2020-08-10 DIAGNOSIS — D638 Anemia in other chronic diseases classified elsewhere: Secondary | ICD-10-CM | POA: Diagnosis not present

## 2020-08-10 DIAGNOSIS — E785 Hyperlipidemia, unspecified: Secondary | ICD-10-CM | POA: Diagnosis not present

## 2020-08-10 DIAGNOSIS — N2581 Secondary hyperparathyroidism of renal origin: Secondary | ICD-10-CM | POA: Diagnosis not present

## 2020-08-10 DIAGNOSIS — I129 Hypertensive chronic kidney disease with stage 1 through stage 4 chronic kidney disease, or unspecified chronic kidney disease: Secondary | ICD-10-CM | POA: Diagnosis not present

## 2020-08-10 DIAGNOSIS — E1122 Type 2 diabetes mellitus with diabetic chronic kidney disease: Secondary | ICD-10-CM | POA: Diagnosis not present

## 2020-08-20 DIAGNOSIS — I129 Hypertensive chronic kidney disease with stage 1 through stage 4 chronic kidney disease, or unspecified chronic kidney disease: Secondary | ICD-10-CM | POA: Diagnosis not present

## 2020-08-20 DIAGNOSIS — E1122 Type 2 diabetes mellitus with diabetic chronic kidney disease: Secondary | ICD-10-CM | POA: Diagnosis not present

## 2020-08-20 DIAGNOSIS — N1832 Chronic kidney disease, stage 3b: Secondary | ICD-10-CM | POA: Diagnosis not present

## 2020-08-20 DIAGNOSIS — I10 Essential (primary) hypertension: Secondary | ICD-10-CM | POA: Diagnosis not present

## 2020-08-20 DIAGNOSIS — E1129 Type 2 diabetes mellitus with other diabetic kidney complication: Secondary | ICD-10-CM | POA: Diagnosis not present

## 2020-08-20 DIAGNOSIS — E785 Hyperlipidemia, unspecified: Secondary | ICD-10-CM | POA: Diagnosis not present

## 2020-08-26 DIAGNOSIS — I10 Essential (primary) hypertension: Secondary | ICD-10-CM | POA: Diagnosis not present

## 2020-08-26 DIAGNOSIS — N183 Chronic kidney disease, stage 3 unspecified: Secondary | ICD-10-CM | POA: Diagnosis not present

## 2020-09-11 DIAGNOSIS — I10 Essential (primary) hypertension: Secondary | ICD-10-CM | POA: Diagnosis not present

## 2020-09-17 NOTE — Progress Notes (Deleted)
Primary Physician/Referring:  Ginger Organ., MD  Patient ID: Joseph [facility], male    DOB: March 23, 1951, 70 y.o.   MRN: [id number]  No chief complaint on file.  HPI:    Joseph Nguyen  is a 70 y.o. male  with hypertension, hyperlipidemia, and controlled diabetes. He also has h/o asymptomatic multiple cysts in the kidney that he states has slightly progressed and noted to have mild proteinuria by Dr. Marval Regal.  He has had marked fluctuation in blood pressure and states that since being on present medications, blood pressure and heart rate has been very stable.   ***Patient presents for follow-up on hypertension.  He called the office on 07/30/2020 with complaints of fatigue and weakness associated with blood pressure reading 115/59 mmHg.  He was advised at that time to reduce labetalol to 100 mg half tablet twice daily.    ***  And he is presently asymptomatic.  Past Medical History:  Diagnosis Date  . Controlled type 2 diabetes mellitus without complication ([place]) 0/01/3817  . DM (diabetes mellitus) ([place])   . Essential hypertension 07/27/2011  . Hiatal hernia   . Hypercholesterolemia   . Kidney disease    Polycystic,   . Mixed hyperlipidemia 11/05/2018  . Obesity   . Polycystic kidney disease 11/05/2018   Past Surgical History:  Procedure Laterality Date  . CARDIOVASCULAR STRESS TEST  10-18-2005   EF 79%  . TUMOR REMOVAL  1968   Left Breast Benign   Family History  Problem Relation Age of Onset  . Cancer Mother     Social History   Tobacco Use  . Smoking status: Former Smoker    Packs/day: 0.25    Years: 5.00    Pack years: 1.25    Types: Cigarettes    Quit date: 02/25/1979    Years since quitting: 41.5  . Smokeless tobacco: Never Used  . Tobacco comment: Quite 63yr+ ago  Substance Use Topics  . Alcohol use: Yes    Comment: occasionally   Marital Status: Married  ROS  Review of Systems  Cardiovascular: Negative for chest pain, dyspnea on exertion, leg  swelling, palpitations and syncope.  Respiratory: Positive for snoring (occasional).    Objective  There were no vitals taken for this visit.  Vitals with BMI 07/26/2020 07/26/2020 07/26/2020  Height - - -  Weight - - -  BMI - - -  Systolic [id number] Diastolic 70 62 61  Pulse 57 66 67     Physical Exam Constitutional:      General: He is not in acute distress.    Appearance: He is well-developed.  Cardiovascular:     Rate and Rhythm: Normal rate and regular rhythm.     Pulses: Intact distal pulses.     Heart sounds: No murmur heard. No gallop.      Comments: No leg edema. No JVD.   Pulmonary:     Effort: Pulmonary effort is normal. No accessory muscle usage or respiratory distress.     Breath sounds: Normal breath sounds.  Abdominal:     Palpations: Abdomen is soft.    Laboratory examination:   Recent Labs    07/26/20 1140  NA 136  K 4.4  CL 106  CO2 20*  GLUCOSE 222*  BUN 34*  CREATININE 2.47*  CALCIUM 9.1  GFRNONAA 27*   CrCl cannot be calculated (Patient's most recent lab result is older than the maximum 21 days allowed.).  CMP Latest Ref Rng &  Units 07/26/2020 07/30/2010  Glucose 70 - 99 mg/dL 222(H) 101(H)  BUN 8 - 23 mg/dL 34(H) 21  Creatinine 0.61 - 1.24 mg/dL 2.47(H) 1.11  Sodium 135 - 145 mmol/L 136 141  Potassium 3.5 - 5.1 mmol/L 4.4 4.5  Chloride 98 - 111 mmol/L 106 105  CO2 22 - 32 mmol/L 20(L) 25  Calcium 8.9 - 10.3 mg/dL 9.1 9.9  Total Protein 6.5 - 8.1 g/dL 6.9 7.3  Total Bilirubin 0.3 - 1.2 mg/dL 0.9 0.8  Alkaline Phos 38 - 126 U/L 67 61  AST 15 - 41 U/L 22 21  ALT 0 - 44 U/L 18 21   CBC Latest Ref Rng & Units 07/26/2020  WBC 4.0 - 10.5 K/uL 5.7  Hemoglobin 13.0 - 17.0 g/dL 12.3(L)  Hematocrit 39.0 - 52.0 % 38.1(L)  Platelets 150 - 400 K/uL 170   Lipid Panel     Component Value Date/Time   CHOL 128 07/30/2010 0929   TRIG 102 07/30/2010 0929   HDL 42 07/30/2010 0929   CHOLHDL 3.0 07/30/2010 0929   VLDL 20 07/30/2010 0929   LDLCALC 66  07/30/2010 0929   HEMOGLOBIN A1C No results found for: HGBA1C, MPG TSH No results for input(s): TSH in the last 8760 hours.  External labs:  07/28/2020: Glucose 148, BUN 32, creatinine 2.0, GFR 33.1, sodium 141, potassium 4.3, Hemoglobin 11.7, hematocrit 35.8, MCV 84.0, platelet 143  07/08/2020:  A1c 7.3%  06/03/2020:  Total cholesterol 149, triglycerides 134, HDL 36, LDL 86, non-HDL 113 Lipoprotein B 95 TSH 1.96  Glucose Random 146.000 M 07/18/2019  BUN 30.000 M 07/18/2019 Creatinine, Serum 1.930 MG/ 07/18/2019  Lipid Panel  05/02/2019 Cholesterol, total 158.000 m 05/02/2019 Triglycerides 173.000 05/02/2019 HDL 34 MG/DL 05/02/2019 LDL 89.000 mg 05/02/2019  A1C 6.700 % 05/02/2019  MicroAlbumin Urine 5.000 05/02/2019 MicroAlbumin/Creat 5.1 MG/DL 05/02/2019  TSH 3.240 micr 05/02/2019  PSA 0.679 ng/ 05/02/2019  11/09/2018: CBC normal, serum glucose 166 minute grams, serum creatinine 1.24, eGFR 60 mL.  04/07/2017: Serum glucose 165 mg, BUN 23, creatinine 1.2, eGFR 60 ML, potassium 4.2. CBC normal.  Total cholesterol 138, triglycerides 145, HDL 32, LDL 77. Non-HDL cholesterol 106. Apo B normal at 82, TSH normal, PSA normal.   Medications and allergies   Allergies  Allergen Reactions  . Amlodipine Other (See Comments)    Edema- ankles  . Hydralazine Other (See Comments)    Chest pain and weakness  . Jardiance [Empagliflozin] Nausea Only and Other (See Comments)    Was taken off of Metformin because of Polycystic kidney disease (taken successfully for 4-5 years) and has been experiencing severe listlessness, in addition to nausea  . Zocor [Simvastatin - High Dose] Other (See Comments)    Myalgias      Current Outpatient Medications  Medication Instructions  . acetaminophen (TYLENOL) 325-650 mg, Oral, Every 8 hours PRN  . aspirin EC 81 mg, Oral, Daily  . cholecalciferol (VITAMIN D3) 1,000 Units, Oral, Daily  . CoQ10 100 mg, Oral, Daily  . empagliflozin (JARDIANCE) 10  mg, Oral, Daily  . Fish Oil 1,000 mg, Oral, Daily  . fluticasone (FLONASE) 50 MCG/ACT nasal spray 2 sprays, Each Nare, Daily PRN  . labetalol (NORMODYNE) 100 MG tablet TAKE 1 TABLET BY MOUTH TWICE DAILY  . multivitamin-lutein (OCUVITE-LUTEIN) CAPS capsule 1 capsule, Oral, Daily  . olmesartan-hydrochlorothiazide (BENICAR HCT) 20-12.5 MG tablet 1 tablet, Oral, Daily  . omeprazole (PRILOSEC) 40 mg, Oral, Daily before breakfast  . Ozempic (0.25 or 0.5 MG/DOSE) 0.5 mg, Subcutaneous,  Every Wed  . Poly-Iron 150 150 mg, Oral, Daily with breakfast  . rosuvastatin (CRESTOR) 10 MG tablet take 1 tablet by mouth once daily  . vitamin B-12 (CYANOCOBALAMIN) 1,000 mcg, Oral, Daily  . vitamin C (ASCORBIC ACID) 500 mg, Oral, Daily  . vitamin E 100 Units, Oral, Daily   Radiology:   Chest x-ray PA and lateral view normal.  Ultrasound of the abdomen 07/10/2019 revealed bilateral mild cystic disease of the kidney, findings suggestive of polycystic renal disease on cystic renal disease in the setting of renal failure.  No definite hydronephrosis bilaterally.  US Abdominal Aorta 01/06/2014: No aortic aneurysm. Abdominal aorta measures 2.3 x 2.5 cm in diameter.  Cardiac Studies:   Lexiscan Myoview stress test 11/10/2018 [facility]):  No evidence of ischemia, normal LVEF.  Low risk study.  Echocardiogram 06/18/2019:  Normal LV systolic function with EF 66%. Left ventricle cavity is normal in size. Normal global wall motion. Normal diastolic filling pattern.  Calculated EF 66%.  Left atrial cavity is mildly dilated at 4.0 cm and 51 mL. Normal interatrial septum.  Structurally normal tricuspid valve. Mild tricuspid regurgitation. No evidence of pulmonary hypertension.  Structurally normal pulmonic valve. Mild pulmonic regurgitation.  EKG ***  06/17/2019: Sinus bradycardia at 57 bpm, normal axis, no evidence of ischemia.   Assessment   No diagnosis found.   No orders of the defined types  were placed in this encounter.   There are no discontinued medications.  Recommendations:   Joseph Nguyen  is a 70 y.o. male  with hypertension, hyperlipidemia, and controlled diabetes. He also has h/o asymptomatic multiple cysts in the kidney that he states has slightly progressed and noted to have mild proteinuria by Dr. Marval Regal.  He has had marked fluctuation in blood pressure and states that since being on present medications, blood pressure and heart rate has been very stable.   ***Patient presents for follow-up on hypertension.  He called the office on 07/30/2020 with complaints of fatigue and weakness associated with blood pressure reading 115/59 mmHg.  He was advised at that time to reduce labetalol to 100 mg half tablet twice daily.    ***lipids controlled. A1c 7.3 - controlled.   ***  Blood pressure is well controlled with current medications and appears to be much more stable. Heart rate has also been stable and improved. Will continue with Labetalol and Benicar HCT. He is followed by Nephrology for management of polycystic kidney disease that patient states has slightly progressed. I will defer to Nephrology if he can continue with Benicar HCT. I have asked patient to contact me if further adjustments need to be made.    With regard to hyperlipidemia, he still has mild hypertriglyceridemia and LDL is not at goal but patient has multiple medication intolerances and presently on moderate dose of high intensity Crestor at 10 mg daily, continue the same.  Otherwise stable from cardiac standpoint, I will see him back in a year.  Adrian Prows, MD, [facility] 09/17/2020, 12:57 PM [facility] Pager: [phone]  Office: [phone]  CC Dr. Marval Regal; Dr. Lutricia Feil

## 2020-09-18 ENCOUNTER — Ambulatory Visit: Payer: Medicare Other | Admitting: Student

## 2020-09-19 DIAGNOSIS — N1832 Chronic kidney disease, stage 3b: Secondary | ICD-10-CM | POA: Diagnosis not present

## 2020-09-19 DIAGNOSIS — I129 Hypertensive chronic kidney disease with stage 1 through stage 4 chronic kidney disease, or unspecified chronic kidney disease: Secondary | ICD-10-CM | POA: Diagnosis not present

## 2020-09-19 DIAGNOSIS — E785 Hyperlipidemia, unspecified: Secondary | ICD-10-CM | POA: Diagnosis not present

## 2020-09-19 DIAGNOSIS — E1122 Type 2 diabetes mellitus with diabetic chronic kidney disease: Secondary | ICD-10-CM | POA: Diagnosis not present

## 2020-09-24 NOTE — Progress Notes (Deleted)
Primary Physician/Referring:  Ginger Organ., MD  Patient ID: Joseph Nguyen, male    DOB: August 19, 1950, 70 y.o.   MRN: [id number]  No chief complaint on file.  HPI:    Joseph Nguyen  is a 70 y.o. male  with hypertension, hyperlipidemia, and controlled diabetes. He also has h/o asymptomatic multiple cysts in the kidney that he states has slightly progressed and noted to have mild proteinuria by Dr. Marval Regal.  He has had marked fluctuation in blood pressure and states that since being on present medications, blood pressure and heart rate has been very stable.   ***Patient presents for follow-up on hypertension.  He called the office on 07/30/2020 with complaints of fatigue and weakness associated with blood pressure reading 115/59 mmHg.  He was advised at that time to reduce labetalol to 100 mg half tablet twice daily.    ***  And he is presently asymptomatic.  Past Medical History:  Diagnosis Date  . Controlled type 2 diabetes mellitus without complication ([place]) 09/07/4079  . DM (diabetes mellitus) ([place])   . Essential hypertension 07/27/2011  . Hiatal hernia   . Hypercholesterolemia   . Kidney disease    Polycystic,   . Mixed hyperlipidemia 11/05/2018  . Obesity   . Polycystic kidney disease 11/05/2018   Past Surgical History:  Procedure Laterality Date  . CARDIOVASCULAR STRESS TEST  10-18-2005   EF 79%  . TUMOR REMOVAL  1968   Left Breast Benign   Family History  Problem Relation Age of Onset  . Cancer Mother     Social History   Tobacco Use  . Smoking status: Former Smoker    Packs/day: 0.25    Years: 5.00    Pack years: 1.25    Types: Cigarettes    Quit date: 02/25/1979    Years since quitting: 41.6  . Smokeless tobacco: Never Used  . Tobacco comment: Quite 43yr+ ago  Substance Use Topics  . Alcohol use: Yes    Comment: occasionally   Marital Status: Married  ROS  Review of Systems  Cardiovascular: Negative for chest pain, dyspnea on exertion, leg  swelling, palpitations and syncope.  Respiratory: Positive for snoring (occasional).    Objective  There were no vitals taken for this visit.  Vitals with BMI 07/26/2020 07/26/2020 07/26/2020  Height - - -  Weight - - -  BMI - - -  Systolic [id number] Diastolic 70 62 61  Pulse 57 66 67     Physical Exam Constitutional:      General: He is not in acute distress.    Appearance: He is well-developed.  Cardiovascular:     Rate and Rhythm: Normal rate and regular rhythm.     Pulses: Intact distal pulses.     Heart sounds: No murmur heard. No gallop.      Comments: No leg edema. No JVD.   Pulmonary:     Effort: Pulmonary effort is normal. No accessory muscle usage or respiratory distress.     Breath sounds: Normal breath sounds.  Abdominal:     Palpations: Abdomen is soft.    Laboratory examination:   Recent Labs    07/26/20 1140  NA 136  K 4.4  CL 106  CO2 20*  GLUCOSE 222*  BUN 34*  CREATININE 2.47*  CALCIUM 9.1  GFRNONAA 27*   CrCl cannot be calculated (Patient's most recent lab result is older than the maximum 21 days allowed.).  CMP Latest Ref Rng &  Units 07/26/2020 07/30/2010  Glucose 70 - 99 mg/dL 222(H) 101(H)  BUN 8 - 23 mg/dL 34(H) 21  Creatinine 0.61 - 1.24 mg/dL 2.47(H) 1.11  Sodium 135 - 145 mmol/L 136 141  Potassium 3.5 - 5.1 mmol/L 4.4 4.5  Chloride 98 - 111 mmol/L 106 105  CO2 22 - 32 mmol/L 20(L) 25  Calcium 8.9 - 10.3 mg/dL 9.1 9.9  Total Protein 6.5 - 8.1 g/dL 6.9 7.3  Total Bilirubin 0.3 - 1.2 mg/dL 0.9 0.8  Alkaline Phos 38 - 126 U/L 67 61  AST 15 - 41 U/L 22 21  ALT 0 - 44 U/L 18 21   CBC Latest Ref Rng & Units 07/26/2020  WBC 4.0 - 10.5 K/uL 5.7  Hemoglobin 13.0 - 17.0 g/dL 12.3(L)  Hematocrit 39.0 - 52.0 % 38.1(L)  Platelets 150 - 400 K/uL 170   Lipid Panel     Component Value Date/Time   CHOL 128 07/30/2010 0929   TRIG 102 07/30/2010 0929   HDL 42 07/30/2010 0929   CHOLHDL 3.0 07/30/2010 0929   VLDL 20 07/30/2010 0929   LDLCALC 66  07/30/2010 0929   HEMOGLOBIN A1C No results found for: HGBA1C, MPG TSH No results for input(s): TSH in the last 8760 hours.  External labs:  07/28/2020: Glucose 148, BUN 32, creatinine 2.0, GFR 33.1, sodium 141, potassium 4.3, Hemoglobin 11.7, hematocrit 35.8, MCV 84.0, platelet 143  07/08/2020:  A1c 7.3%  06/03/2020:  Total cholesterol 149, triglycerides 134, HDL 36, LDL 86, non-HDL 113 Lipoprotein B 95 TSH 1.96  Glucose Random 146.000 M 07/18/2019  BUN 30.000 M 07/18/2019 Creatinine, Serum 1.930 MG/ 07/18/2019  Lipid Panel  05/02/2019 Cholesterol, total 158.000 m 05/02/2019 Triglycerides 173.000 05/02/2019 HDL 34 MG/DL 05/02/2019 LDL 89.000 mg 05/02/2019  A1C 6.700 % 05/02/2019  MicroAlbumin Urine 5.000 05/02/2019 MicroAlbumin/Creat 5.1 MG/DL 05/02/2019  TSH 3.240 micr 05/02/2019  PSA 0.679 ng/ 05/02/2019  11/09/2018: CBC normal, serum glucose 166 minute grams, serum creatinine 1.24, eGFR 60 mL.  04/07/2017: Serum glucose 165 mg, BUN 23, creatinine 1.2, eGFR 60 ML, potassium 4.2. CBC normal.  Total cholesterol 138, triglycerides 145, HDL 32, LDL 77. Non-HDL cholesterol 106. Apo B normal at 82, TSH normal, PSA normal.   Medications and allergies   Allergies  Allergen Reactions  . Amlodipine Other (See Comments)    Edema- ankles  . Hydralazine Other (See Comments)    Chest pain and weakness  . Jardiance [Empagliflozin] Nausea Only and Other (See Comments)    Was taken off of Metformin because of Polycystic kidney disease (taken successfully for 4-5 years) and has been experiencing severe listlessness, in addition to nausea  . Zocor [Simvastatin - High Dose] Other (See Comments)    Myalgias      Current Outpatient Medications  Medication Instructions  . acetaminophen (TYLENOL) 325-650 mg, Oral, Every 8 hours PRN  . aspirin EC 81 mg, Oral, Daily  . cholecalciferol (VITAMIN D3) 1,000 Units, Oral, Daily  . CoQ10 100 mg, Oral, Daily  . empagliflozin (JARDIANCE) 10  mg, Oral, Daily  . Fish Oil 1,000 mg, Oral, Daily  . fluticasone (FLONASE) 50 MCG/ACT nasal spray 2 sprays, Each Nare, Daily PRN  . labetalol (NORMODYNE) 100 MG tablet TAKE 1 TABLET BY MOUTH TWICE DAILY  . multivitamin-lutein (OCUVITE-LUTEIN) CAPS capsule 1 capsule, Oral, Daily  . olmesartan-hydrochlorothiazide (BENICAR HCT) 20-12.5 MG tablet 1 tablet, Oral, Daily  . omeprazole (PRILOSEC) 40 mg, Oral, Daily before breakfast  . Ozempic (0.25 or 0.5 MG/DOSE) 0.5 mg, Subcutaneous,  Every Wed  . Poly-Iron 150 150 mg, Oral, Daily with breakfast  . rosuvastatin (CRESTOR) 10 MG tablet take 1 tablet by mouth once daily  . vitamin B-12 (CYANOCOBALAMIN) 1,000 mcg, Oral, Daily  . vitamin C (ASCORBIC ACID) 500 mg, Oral, Daily  . vitamin E 100 Units, Oral, Daily   Radiology:   Chest x-ray PA and lateral view normal.  Ultrasound of the abdomen 07/10/2019 revealed bilateral mild cystic disease of the kidney, findings suggestive of polycystic renal disease on cystic renal disease in the setting of renal failure.  No definite hydronephrosis bilaterally.  US Abdominal Aorta 01/06/2014: No aortic aneurysm. Abdominal aorta measures 2.3 x 2.5 cm in diameter.  Cardiac Studies:   Lexiscan Myoview stress test 11/10/2018 [facility]):  No evidence of ischemia, normal LVEF.  Low risk study.  Echocardiogram 06/18/2019:  Normal LV systolic function with EF 66%. Left ventricle cavity is normal in size. Normal global wall motion. Normal diastolic filling pattern.  Calculated EF 66%.  Left atrial cavity is mildly dilated at 4.0 cm and 51 mL. Normal interatrial septum.  Structurally normal tricuspid valve. Mild tricuspid regurgitation. No evidence of pulmonary hypertension.  Structurally normal pulmonic valve. Mild pulmonic regurgitation.  EKG ***  06/17/2019: Sinus bradycardia at 57 bpm, normal axis, no evidence of ischemia.   Assessment   No diagnosis found.   No orders of the defined types  were placed in this encounter.   There are no discontinued medications.  Recommendations:   FLEETWOOD PIERRON  is a 70 y.o. male  with hypertension, hyperlipidemia, and controlled diabetes. He also has h/o asymptomatic multiple cysts in the kidney that he states has slightly progressed and noted to have mild proteinuria by Dr. Marval Regal.  He has had marked fluctuation in blood pressure and states that since being on present medications, blood pressure and heart rate has been very stable.   ***Patient presents for follow-up on hypertension.  He called the office on 07/30/2020 with complaints of fatigue and weakness associated with blood pressure reading 115/59 mmHg.  He was advised at that time to reduce labetalol to 100 mg half tablet twice daily.    ***lipids controlled. A1c 7.3 - controlled.   ***  Blood pressure is well controlled with current medications and appears to be much more stable. Heart rate has also been stable and improved. Will continue with Labetalol and Benicar HCT. He is followed by Nephrology for management of polycystic kidney disease that patient states has slightly progressed. I will defer to Nephrology if he can continue with Benicar HCT. I have asked patient to contact me if further adjustments need to be made.    With regard to hyperlipidemia, he still has mild hypertriglyceridemia and LDL is not at goal but patient has multiple medication intolerances and presently on moderate dose of high intensity Crestor at 10 mg daily, continue the same.  Otherwise stable from cardiac standpoint, I will see him back in a year.  Adrian Prows, MD, [facility] 09/24/2020, 1:14 PM [facility] [place] Pager: [phone]  Office: [phone]  CC Dr. Marval Regal; Dr. Lutricia Feil

## 2020-09-25 DIAGNOSIS — E1129 Type 2 diabetes mellitus with other diabetic kidney complication: Secondary | ICD-10-CM | POA: Diagnosis not present

## 2020-09-25 DIAGNOSIS — J019 Acute sinusitis, unspecified: Secondary | ICD-10-CM | POA: Diagnosis not present

## 2020-09-25 DIAGNOSIS — N1832 Chronic kidney disease, stage 3b: Secondary | ICD-10-CM | POA: Diagnosis not present

## 2020-09-25 DIAGNOSIS — I129 Hypertensive chronic kidney disease with stage 1 through stage 4 chronic kidney disease, or unspecified chronic kidney disease: Secondary | ICD-10-CM | POA: Diagnosis not present

## 2020-09-28 ENCOUNTER — Ambulatory Visit: Payer: Medicare Other | Admitting: Student

## 2020-09-28 DIAGNOSIS — E782 Mixed hyperlipidemia: Secondary | ICD-10-CM

## 2020-09-28 DIAGNOSIS — R001 Bradycardia, unspecified: Secondary | ICD-10-CM

## 2020-09-28 DIAGNOSIS — I10 Essential (primary) hypertension: Secondary | ICD-10-CM

## 2020-10-01 DIAGNOSIS — D638 Anemia in other chronic diseases classified elsewhere: Secondary | ICD-10-CM | POA: Diagnosis not present

## 2020-10-01 DIAGNOSIS — Q612 Polycystic kidney, adult type: Secondary | ICD-10-CM | POA: Diagnosis not present

## 2020-10-01 DIAGNOSIS — E1122 Type 2 diabetes mellitus with diabetic chronic kidney disease: Secondary | ICD-10-CM | POA: Diagnosis not present

## 2020-10-01 DIAGNOSIS — I129 Hypertensive chronic kidney disease with stage 1 through stage 4 chronic kidney disease, or unspecified chronic kidney disease: Secondary | ICD-10-CM | POA: Diagnosis not present

## 2020-10-01 DIAGNOSIS — E785 Hyperlipidemia, unspecified: Secondary | ICD-10-CM | POA: Diagnosis not present

## 2020-10-01 DIAGNOSIS — N2581 Secondary hyperparathyroidism of renal origin: Secondary | ICD-10-CM | POA: Diagnosis not present

## 2020-10-01 DIAGNOSIS — N183 Chronic kidney disease, stage 3 unspecified: Secondary | ICD-10-CM | POA: Diagnosis not present

## 2020-10-02 ENCOUNTER — Ambulatory Visit: Payer: Medicare Other | Admitting: Cardiology

## 2020-10-16 DIAGNOSIS — R197 Diarrhea, unspecified: Secondary | ICD-10-CM | POA: Diagnosis not present

## 2020-10-23 DIAGNOSIS — N183 Chronic kidney disease, stage 3 unspecified: Secondary | ICD-10-CM | POA: Diagnosis not present

## 2020-11-09 DIAGNOSIS — E669 Obesity, unspecified: Secondary | ICD-10-CM | POA: Diagnosis not present

## 2020-11-09 DIAGNOSIS — I129 Hypertensive chronic kidney disease with stage 1 through stage 4 chronic kidney disease, or unspecified chronic kidney disease: Secondary | ICD-10-CM | POA: Diagnosis not present

## 2020-11-09 DIAGNOSIS — Q612 Polycystic kidney, adult type: Secondary | ICD-10-CM | POA: Diagnosis not present

## 2020-11-09 DIAGNOSIS — R0789 Other chest pain: Secondary | ICD-10-CM | POA: Diagnosis not present

## 2020-11-09 DIAGNOSIS — E1129 Type 2 diabetes mellitus with other diabetic kidney complication: Secondary | ICD-10-CM | POA: Diagnosis not present

## 2020-11-09 DIAGNOSIS — R197 Diarrhea, unspecified: Secondary | ICD-10-CM | POA: Diagnosis not present

## 2020-11-09 DIAGNOSIS — E785 Hyperlipidemia, unspecified: Secondary | ICD-10-CM | POA: Diagnosis not present

## 2020-11-09 DIAGNOSIS — N1832 Chronic kidney disease, stage 3b: Secondary | ICD-10-CM | POA: Diagnosis not present

## 2020-11-09 DIAGNOSIS — Z23 Encounter for immunization: Secondary | ICD-10-CM | POA: Diagnosis not present

## 2020-11-11 DIAGNOSIS — Z79899 Other long term (current) drug therapy: Secondary | ICD-10-CM | POA: Diagnosis not present

## 2020-11-11 DIAGNOSIS — N1832 Chronic kidney disease, stage 3b: Secondary | ICD-10-CM | POA: Diagnosis not present

## 2020-11-11 DIAGNOSIS — Q613 Polycystic kidney, unspecified: Secondary | ICD-10-CM | POA: Diagnosis not present

## 2020-11-11 DIAGNOSIS — E1122 Type 2 diabetes mellitus with diabetic chronic kidney disease: Secondary | ICD-10-CM | POA: Diagnosis not present

## 2020-11-11 DIAGNOSIS — I129 Hypertensive chronic kidney disease with stage 1 through stage 4 chronic kidney disease, or unspecified chronic kidney disease: Secondary | ICD-10-CM | POA: Diagnosis not present

## 2020-11-11 DIAGNOSIS — D631 Anemia in chronic kidney disease: Secondary | ICD-10-CM | POA: Diagnosis not present

## 2020-12-09 DIAGNOSIS — N1832 Chronic kidney disease, stage 3b: Secondary | ICD-10-CM | POA: Diagnosis not present

## 2020-12-20 DIAGNOSIS — E785 Hyperlipidemia, unspecified: Secondary | ICD-10-CM | POA: Diagnosis not present

## 2020-12-20 DIAGNOSIS — E1122 Type 2 diabetes mellitus with diabetic chronic kidney disease: Secondary | ICD-10-CM | POA: Diagnosis not present

## 2020-12-20 DIAGNOSIS — N1832 Chronic kidney disease, stage 3b: Secondary | ICD-10-CM | POA: Diagnosis not present

## 2020-12-20 DIAGNOSIS — I129 Hypertensive chronic kidney disease with stage 1 through stage 4 chronic kidney disease, or unspecified chronic kidney disease: Secondary | ICD-10-CM | POA: Diagnosis not present

## 2020-12-25 DIAGNOSIS — Z1152 Encounter for screening for COVID-19: Secondary | ICD-10-CM | POA: Diagnosis not present

## 2021-01-11 DIAGNOSIS — L570 Actinic keratosis: Secondary | ICD-10-CM | POA: Diagnosis not present

## 2021-01-11 DIAGNOSIS — L820 Inflamed seborrheic keratosis: Secondary | ICD-10-CM | POA: Diagnosis not present

## 2021-01-11 DIAGNOSIS — D225 Melanocytic nevi of trunk: Secondary | ICD-10-CM | POA: Diagnosis not present

## 2021-01-11 DIAGNOSIS — L821 Other seborrheic keratosis: Secondary | ICD-10-CM | POA: Diagnosis not present

## 2021-01-11 DIAGNOSIS — Z85828 Personal history of other malignant neoplasm of skin: Secondary | ICD-10-CM | POA: Diagnosis not present

## 2021-01-11 DIAGNOSIS — D044 Carcinoma in situ of skin of scalp and neck: Secondary | ICD-10-CM | POA: Diagnosis not present

## 2021-01-11 DIAGNOSIS — L814 Other melanin hyperpigmentation: Secondary | ICD-10-CM | POA: Diagnosis not present

## 2021-01-11 DIAGNOSIS — D1801 Hemangioma of skin and subcutaneous tissue: Secondary | ICD-10-CM | POA: Diagnosis not present

## 2021-01-18 DIAGNOSIS — I67858 Other hereditary cerebrovascular disease: Secondary | ICD-10-CM | POA: Diagnosis not present

## 2021-01-18 DIAGNOSIS — G4452 New daily persistent headache (NDPH): Secondary | ICD-10-CM | POA: Diagnosis not present

## 2021-01-20 DIAGNOSIS — E785 Hyperlipidemia, unspecified: Secondary | ICD-10-CM | POA: Diagnosis not present

## 2021-01-20 DIAGNOSIS — N1832 Chronic kidney disease, stage 3b: Secondary | ICD-10-CM | POA: Diagnosis not present

## 2021-01-20 DIAGNOSIS — E1122 Type 2 diabetes mellitus with diabetic chronic kidney disease: Secondary | ICD-10-CM | POA: Diagnosis not present

## 2021-01-20 DIAGNOSIS — I129 Hypertensive chronic kidney disease with stage 1 through stage 4 chronic kidney disease, or unspecified chronic kidney disease: Secondary | ICD-10-CM | POA: Diagnosis not present

## 2021-02-03 DIAGNOSIS — N183 Chronic kidney disease, stage 3 unspecified: Secondary | ICD-10-CM | POA: Diagnosis not present

## 2021-02-03 DIAGNOSIS — Q612 Polycystic kidney, adult type: Secondary | ICD-10-CM | POA: Diagnosis not present

## 2021-02-03 DIAGNOSIS — D638 Anemia in other chronic diseases classified elsewhere: Secondary | ICD-10-CM | POA: Diagnosis not present

## 2021-02-03 DIAGNOSIS — N2581 Secondary hyperparathyroidism of renal origin: Secondary | ICD-10-CM | POA: Diagnosis not present

## 2021-02-03 DIAGNOSIS — E1122 Type 2 diabetes mellitus with diabetic chronic kidney disease: Secondary | ICD-10-CM | POA: Diagnosis not present

## 2021-02-03 DIAGNOSIS — I129 Hypertensive chronic kidney disease with stage 1 through stage 4 chronic kidney disease, or unspecified chronic kidney disease: Secondary | ICD-10-CM | POA: Diagnosis not present

## 2021-02-19 DIAGNOSIS — E785 Hyperlipidemia, unspecified: Secondary | ICD-10-CM | POA: Diagnosis not present

## 2021-02-19 DIAGNOSIS — I129 Hypertensive chronic kidney disease with stage 1 through stage 4 chronic kidney disease, or unspecified chronic kidney disease: Secondary | ICD-10-CM | POA: Diagnosis not present

## 2021-02-19 DIAGNOSIS — N1832 Chronic kidney disease, stage 3b: Secondary | ICD-10-CM | POA: Diagnosis not present

## 2021-02-19 DIAGNOSIS — E1122 Type 2 diabetes mellitus with diabetic chronic kidney disease: Secondary | ICD-10-CM | POA: Diagnosis not present

## 2021-02-20 DIAGNOSIS — Z23 Encounter for immunization: Secondary | ICD-10-CM | POA: Diagnosis not present

## 2021-03-10 DIAGNOSIS — N1832 Chronic kidney disease, stage 3b: Secondary | ICD-10-CM | POA: Diagnosis not present

## 2021-03-18 DIAGNOSIS — Z23 Encounter for immunization: Secondary | ICD-10-CM | POA: Diagnosis not present

## 2021-03-22 DIAGNOSIS — E1122 Type 2 diabetes mellitus with diabetic chronic kidney disease: Secondary | ICD-10-CM | POA: Diagnosis not present

## 2021-03-22 DIAGNOSIS — N1832 Chronic kidney disease, stage 3b: Secondary | ICD-10-CM | POA: Diagnosis not present

## 2021-03-22 DIAGNOSIS — I129 Hypertensive chronic kidney disease with stage 1 through stage 4 chronic kidney disease, or unspecified chronic kidney disease: Secondary | ICD-10-CM | POA: Diagnosis not present

## 2021-03-22 DIAGNOSIS — E785 Hyperlipidemia, unspecified: Secondary | ICD-10-CM | POA: Diagnosis not present

## 2021-04-21 DIAGNOSIS — E1122 Type 2 diabetes mellitus with diabetic chronic kidney disease: Secondary | ICD-10-CM | POA: Diagnosis not present

## 2021-04-21 DIAGNOSIS — E785 Hyperlipidemia, unspecified: Secondary | ICD-10-CM | POA: Diagnosis not present

## 2021-04-21 DIAGNOSIS — I129 Hypertensive chronic kidney disease with stage 1 through stage 4 chronic kidney disease, or unspecified chronic kidney disease: Secondary | ICD-10-CM | POA: Diagnosis not present

## 2021-04-21 DIAGNOSIS — N1832 Chronic kidney disease, stage 3b: Secondary | ICD-10-CM | POA: Diagnosis not present

## 2021-04-22 DIAGNOSIS — D485 Neoplasm of uncertain behavior of skin: Secondary | ICD-10-CM | POA: Diagnosis not present

## 2021-04-22 DIAGNOSIS — L820 Inflamed seborrheic keratosis: Secondary | ICD-10-CM | POA: Diagnosis not present

## 2021-04-22 DIAGNOSIS — L905 Scar conditions and fibrosis of skin: Secondary | ICD-10-CM | POA: Diagnosis not present

## 2021-04-22 DIAGNOSIS — Z85828 Personal history of other malignant neoplasm of skin: Secondary | ICD-10-CM | POA: Diagnosis not present

## 2021-04-22 DIAGNOSIS — C4441 Basal cell carcinoma of skin of scalp and neck: Secondary | ICD-10-CM | POA: Diagnosis not present

## 2021-04-22 DIAGNOSIS — L570 Actinic keratosis: Secondary | ICD-10-CM | POA: Diagnosis not present

## 2021-05-14 DIAGNOSIS — J018 Other acute sinusitis: Secondary | ICD-10-CM | POA: Diagnosis not present

## 2021-05-27 DIAGNOSIS — N2581 Secondary hyperparathyroidism of renal origin: Secondary | ICD-10-CM | POA: Diagnosis not present

## 2021-05-27 DIAGNOSIS — N183 Chronic kidney disease, stage 3 unspecified: Secondary | ICD-10-CM | POA: Diagnosis not present

## 2021-05-27 DIAGNOSIS — I129 Hypertensive chronic kidney disease with stage 1 through stage 4 chronic kidney disease, or unspecified chronic kidney disease: Secondary | ICD-10-CM | POA: Diagnosis not present

## 2021-05-27 DIAGNOSIS — E1122 Type 2 diabetes mellitus with diabetic chronic kidney disease: Secondary | ICD-10-CM | POA: Diagnosis not present

## 2021-05-27 DIAGNOSIS — D638 Anemia in other chronic diseases classified elsewhere: Secondary | ICD-10-CM | POA: Diagnosis not present

## 2021-05-27 DIAGNOSIS — E785 Hyperlipidemia, unspecified: Secondary | ICD-10-CM | POA: Diagnosis not present

## 2021-05-27 DIAGNOSIS — Q612 Polycystic kidney, adult type: Secondary | ICD-10-CM | POA: Diagnosis not present

## 2021-05-31 ENCOUNTER — Other Ambulatory Visit: Payer: Self-pay | Admitting: Nephrology

## 2021-05-31 DIAGNOSIS — N183 Chronic kidney disease, stage 3 unspecified: Secondary | ICD-10-CM

## 2021-05-31 DIAGNOSIS — Q612 Polycystic kidney, adult type: Secondary | ICD-10-CM

## 2021-06-16 ENCOUNTER — Ambulatory Visit
Admission: RE | Admit: 2021-06-16 | Discharge: 2021-06-16 | Disposition: A | Discharge status: Home / Self Care | Payer: Medicare Other | Source: Ambulatory Visit | Attending: Nephrology | Admitting: Nephrology

## 2021-06-16 DIAGNOSIS — Q612 Polycystic kidney, adult type: Secondary | ICD-10-CM

## 2021-06-16 DIAGNOSIS — N183 Chronic kidney disease, stage 3 unspecified: Secondary | ICD-10-CM

## 2021-06-16 DIAGNOSIS — N189 Chronic kidney disease, unspecified: Secondary | ICD-10-CM | POA: Diagnosis not present

## 2021-06-17 DIAGNOSIS — H5201 Hypermetropia, right eye: Secondary | ICD-10-CM | POA: Diagnosis not present

## 2021-06-17 DIAGNOSIS — H2513 Age-related nuclear cataract, bilateral: Secondary | ICD-10-CM | POA: Diagnosis not present

## 2021-06-17 DIAGNOSIS — E119 Type 2 diabetes mellitus without complications: Secondary | ICD-10-CM | POA: Diagnosis not present

## 2021-06-17 DIAGNOSIS — H5212 Myopia, left eye: Secondary | ICD-10-CM | POA: Diagnosis not present

## 2021-06-20 DIAGNOSIS — N1832 Chronic kidney disease, stage 3b: Secondary | ICD-10-CM | POA: Diagnosis not present

## 2021-06-20 DIAGNOSIS — E785 Hyperlipidemia, unspecified: Secondary | ICD-10-CM | POA: Diagnosis not present

## 2021-06-20 DIAGNOSIS — I129 Hypertensive chronic kidney disease with stage 1 through stage 4 chronic kidney disease, or unspecified chronic kidney disease: Secondary | ICD-10-CM | POA: Diagnosis not present

## 2021-06-20 DIAGNOSIS — E1122 Type 2 diabetes mellitus with diabetic chronic kidney disease: Secondary | ICD-10-CM | POA: Diagnosis not present

## 2021-06-21 DIAGNOSIS — E538 Deficiency of other specified B group vitamins: Secondary | ICD-10-CM | POA: Diagnosis not present

## 2021-06-21 DIAGNOSIS — E1129 Type 2 diabetes mellitus with other diabetic kidney complication: Secondary | ICD-10-CM | POA: Diagnosis not present

## 2021-06-21 DIAGNOSIS — Z125 Encounter for screening for malignant neoplasm of prostate: Secondary | ICD-10-CM | POA: Diagnosis not present

## 2021-06-21 DIAGNOSIS — E785 Hyperlipidemia, unspecified: Secondary | ICD-10-CM | POA: Diagnosis not present

## 2021-06-21 DIAGNOSIS — I10 Essential (primary) hypertension: Secondary | ICD-10-CM | POA: Diagnosis not present

## 2021-06-29 DIAGNOSIS — Q612 Polycystic kidney, adult type: Secondary | ICD-10-CM | POA: Diagnosis not present

## 2021-06-29 DIAGNOSIS — Z1331 Encounter for screening for depression: Secondary | ICD-10-CM | POA: Diagnosis not present

## 2021-06-29 DIAGNOSIS — I129 Hypertensive chronic kidney disease with stage 1 through stage 4 chronic kidney disease, or unspecified chronic kidney disease: Secondary | ICD-10-CM | POA: Diagnosis not present

## 2021-06-29 DIAGNOSIS — Z1339 Encounter for screening examination for other mental health and behavioral disorders: Secondary | ICD-10-CM | POA: Diagnosis not present

## 2021-06-29 DIAGNOSIS — D696 Thrombocytopenia, unspecified: Secondary | ICD-10-CM | POA: Diagnosis not present

## 2021-06-29 DIAGNOSIS — R82998 Other abnormal findings in urine: Secondary | ICD-10-CM | POA: Diagnosis not present

## 2021-06-29 DIAGNOSIS — E669 Obesity, unspecified: Secondary | ICD-10-CM | POA: Diagnosis not present

## 2021-06-29 DIAGNOSIS — N1832 Chronic kidney disease, stage 3b: Secondary | ICD-10-CM | POA: Diagnosis not present

## 2021-06-29 DIAGNOSIS — I10 Essential (primary) hypertension: Secondary | ICD-10-CM | POA: Diagnosis not present

## 2021-06-29 DIAGNOSIS — E1129 Type 2 diabetes mellitus with other diabetic kidney complication: Secondary | ICD-10-CM | POA: Diagnosis not present

## 2021-06-29 DIAGNOSIS — E785 Hyperlipidemia, unspecified: Secondary | ICD-10-CM | POA: Diagnosis not present

## 2021-06-29 DIAGNOSIS — Z0000 Encounter for general adult medical examination without abnormal findings: Secondary | ICD-10-CM | POA: Diagnosis not present

## 2021-07-05 DIAGNOSIS — N183 Chronic kidney disease, stage 3 unspecified: Secondary | ICD-10-CM | POA: Diagnosis not present

## 2021-07-05 DIAGNOSIS — I10 Essential (primary) hypertension: Secondary | ICD-10-CM | POA: Diagnosis not present

## 2021-07-09 DIAGNOSIS — Z20822 Contact with and (suspected) exposure to covid-19: Secondary | ICD-10-CM | POA: Diagnosis not present

## 2021-07-09 DIAGNOSIS — J029 Acute pharyngitis, unspecified: Secondary | ICD-10-CM | POA: Diagnosis not present

## 2021-07-09 DIAGNOSIS — R051 Acute cough: Secondary | ICD-10-CM | POA: Diagnosis not present

## 2021-07-09 DIAGNOSIS — R0982 Postnasal drip: Secondary | ICD-10-CM | POA: Diagnosis not present

## 2021-07-09 DIAGNOSIS — Z20818 Contact with and (suspected) exposure to other bacterial communicable diseases: Secondary | ICD-10-CM | POA: Diagnosis not present

## 2021-07-09 DIAGNOSIS — R5383 Other fatigue: Secondary | ICD-10-CM | POA: Diagnosis not present

## 2021-07-09 DIAGNOSIS — J069 Acute upper respiratory infection, unspecified: Secondary | ICD-10-CM | POA: Diagnosis not present

## 2021-07-18 IMAGING — CR DG CHEST 2V
2 series · 2 of 2 positions shown · non-contrast
Comparison: None.

CLINICAL DATA: Fatigue and low back pain for 4-5 days.

EXAM:
CHEST - 2 VIEW

[chest pa]
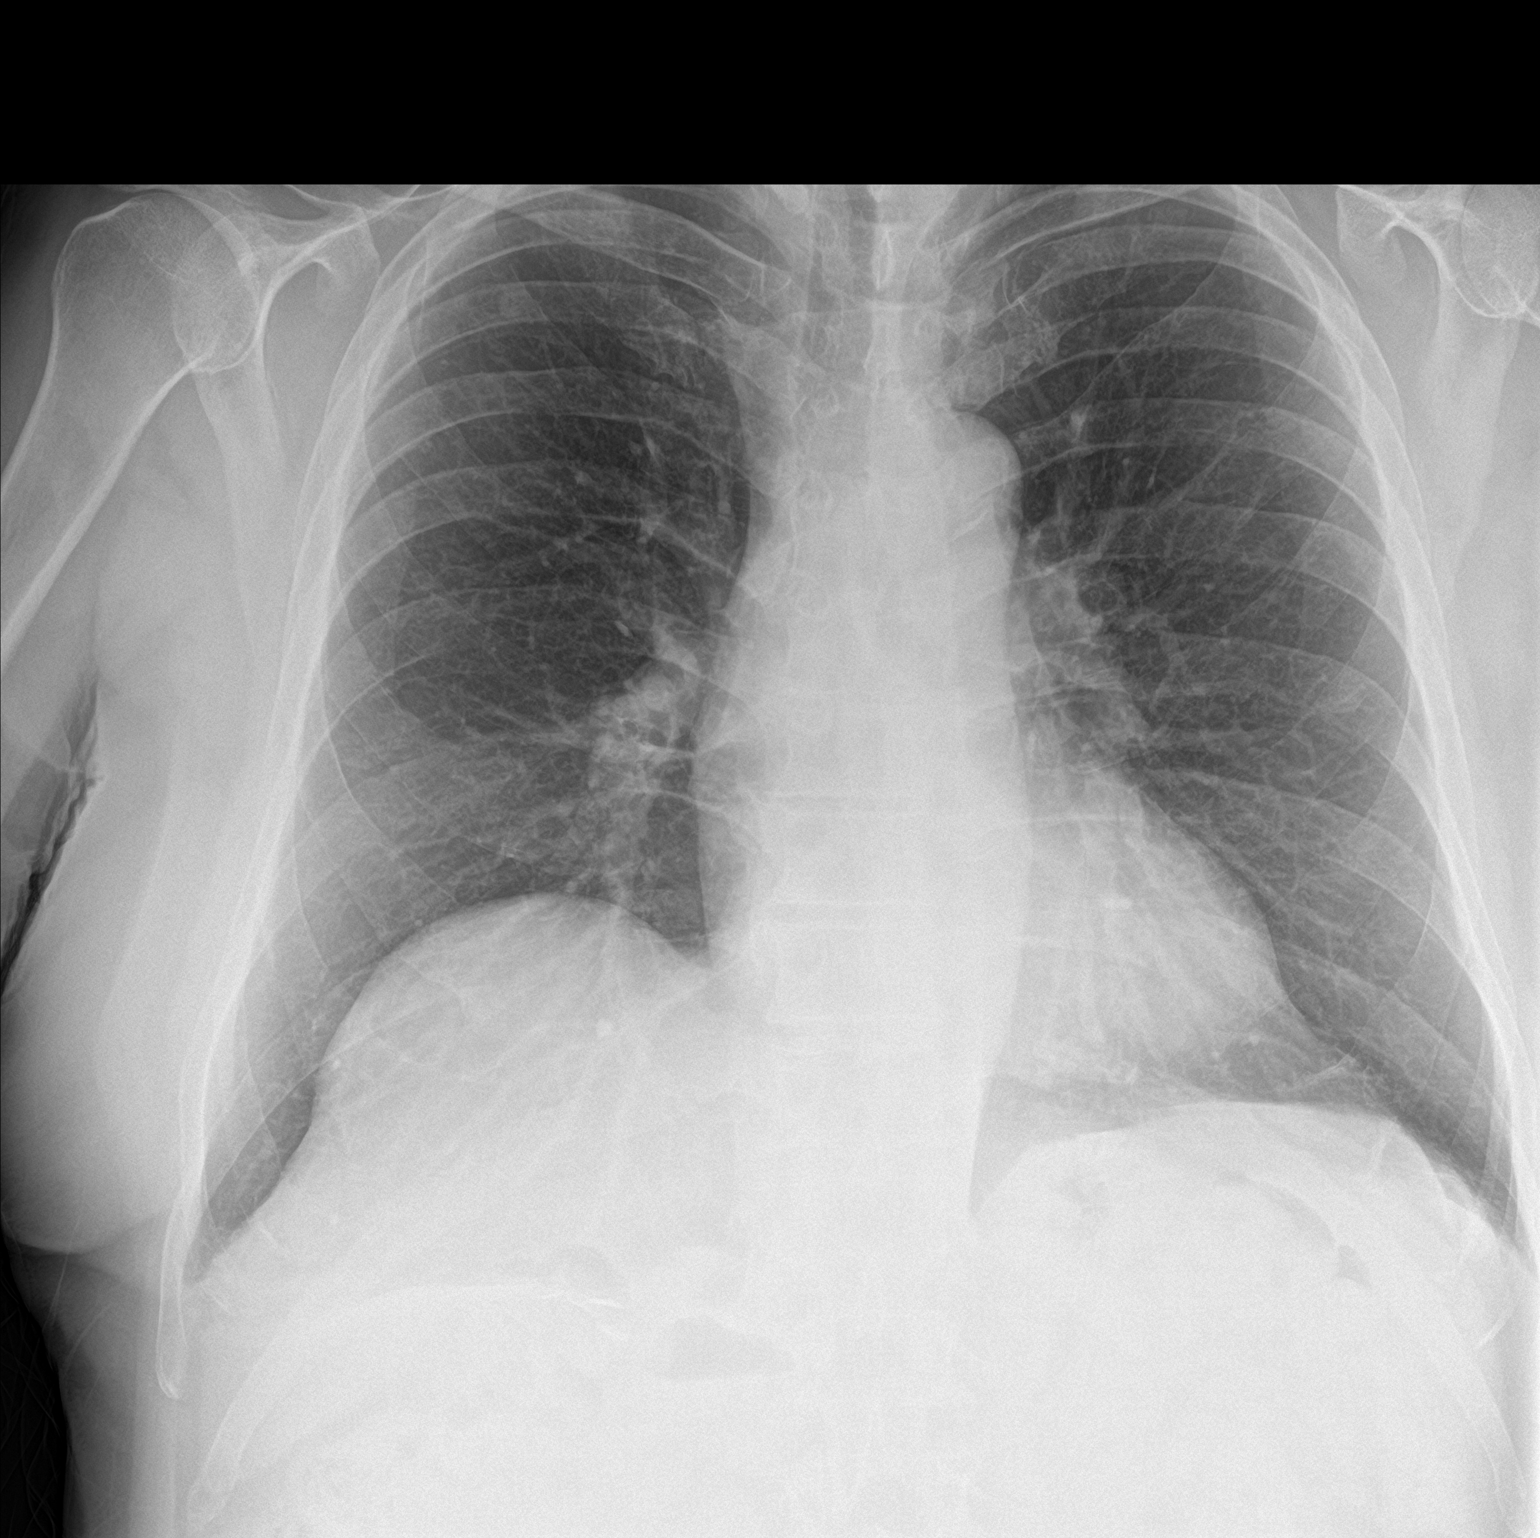

[chest lat]
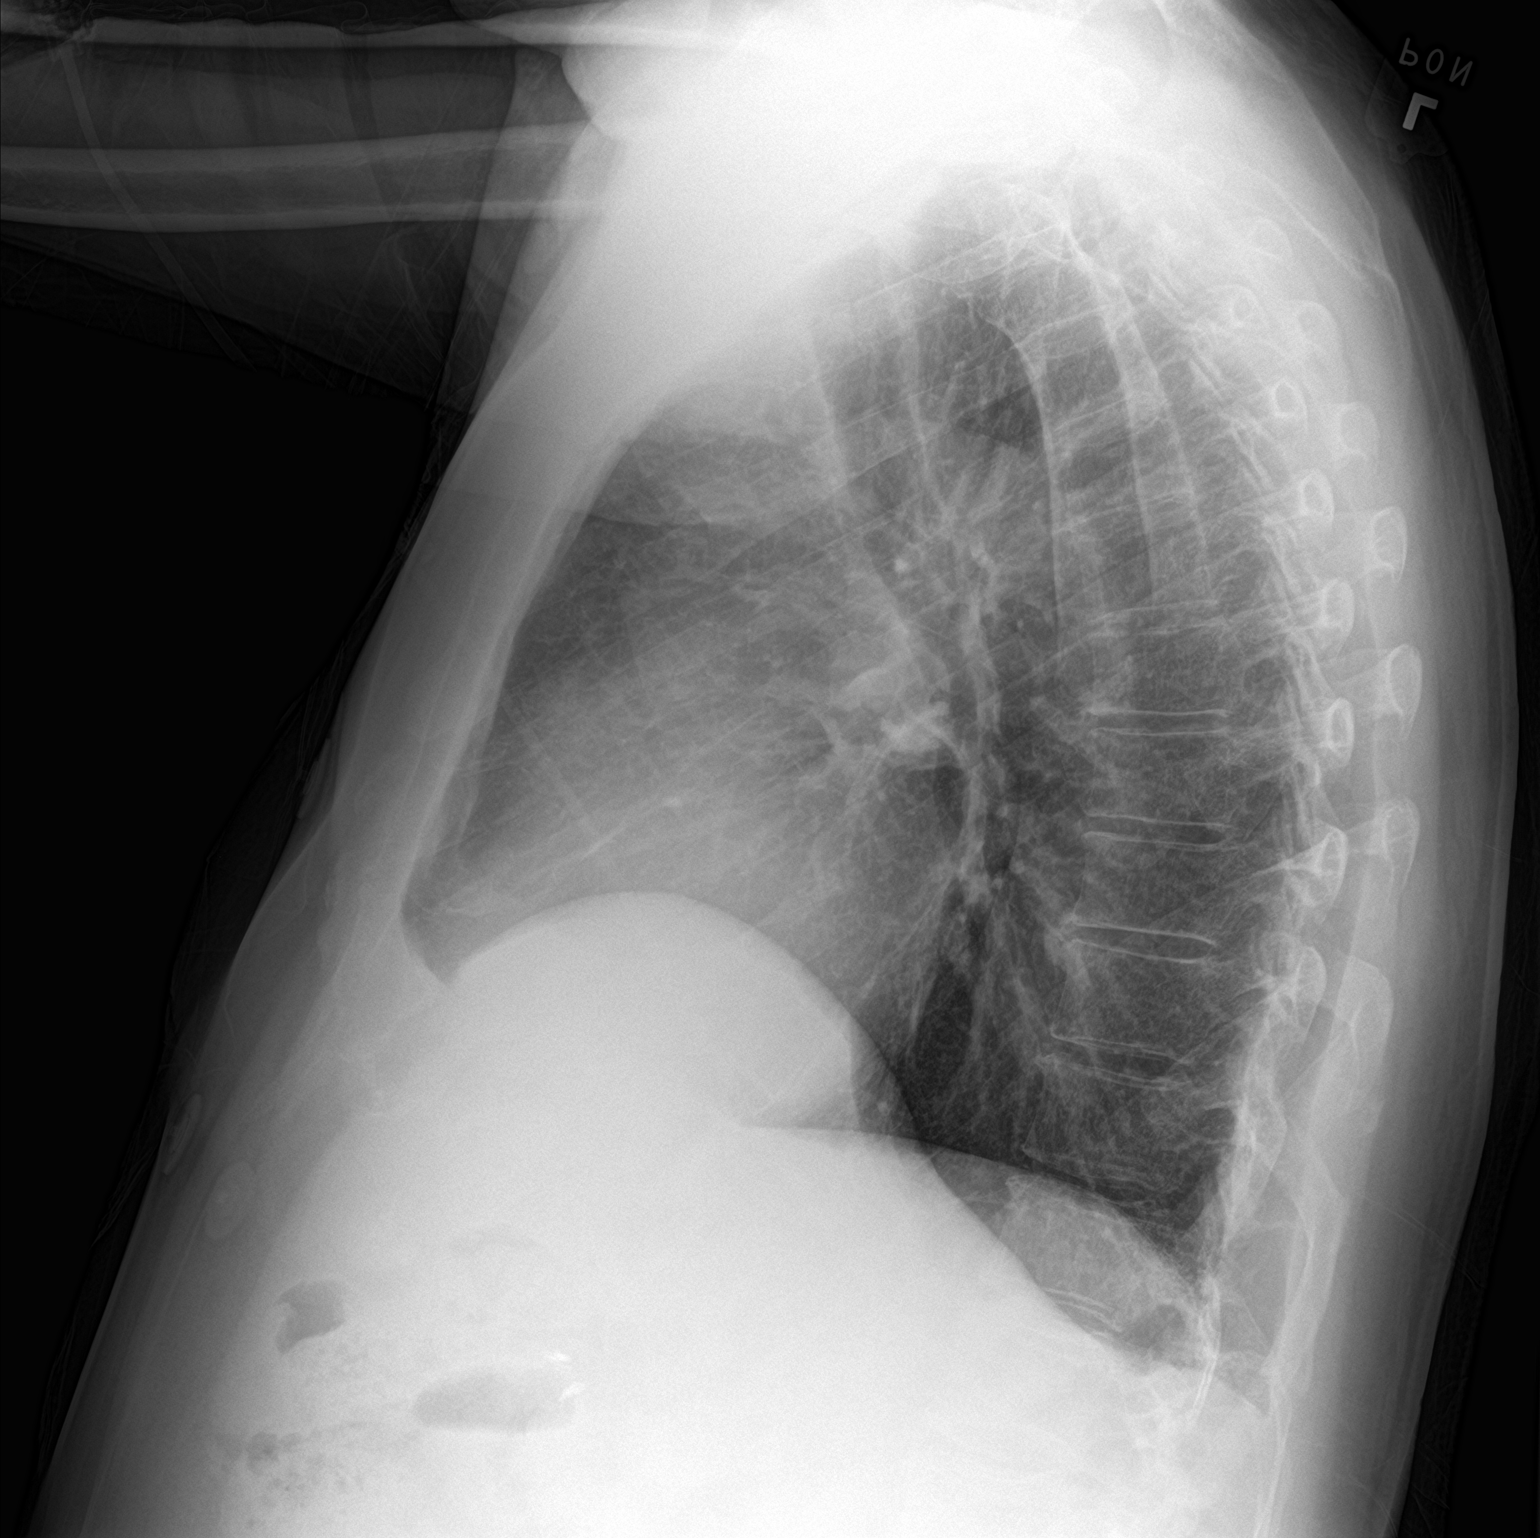

[2 of 2 positions shown; findings below may reference images not displayed]

FINDINGS: Lungs clear. Heart size normal. Aortic atherosclerosis. No
pneumothorax or pleural fluid. Eventration right hemidiaphragm
noted.
IMPRESSION: No acute disease.

Aortic Atherosclerosis (1CXUH-I93.3).

## 2021-07-18 IMAGING — CT CT HEAD W/O CM
4 series · 17 of 47 positions shown, 19 images · non-contrast
Comparison: None.

CLINICAL DATA: Headache.

EXAM:
CT HEAD WITHOUT CONTRAST
TECHNIQUE: Contiguous axial images were obtained from the base of the skull
through the vertex without intravenous contrast.

[Series 3: head wo · axial · 0.44mm/px · z∈[-140,-20]mm · 7 of 32 slices shown, 9 images]
[im 4/32  brain]
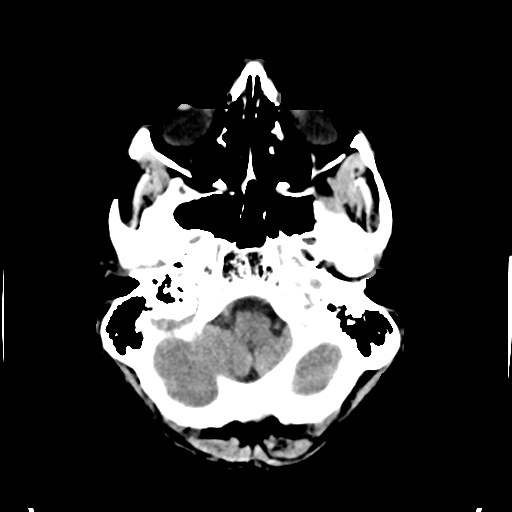
[im 4/32  bone]
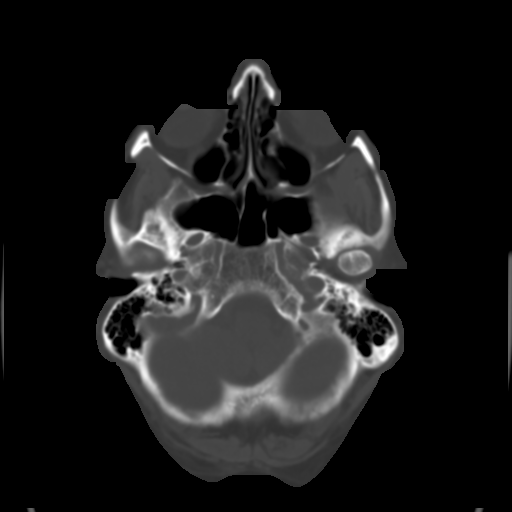
[im 8/32  brain]
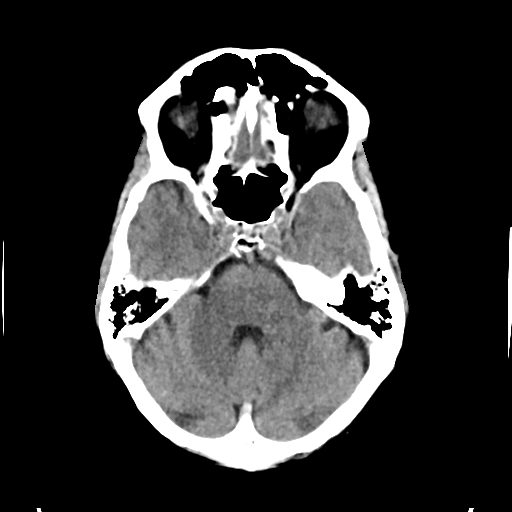
[im 12/32  brain]
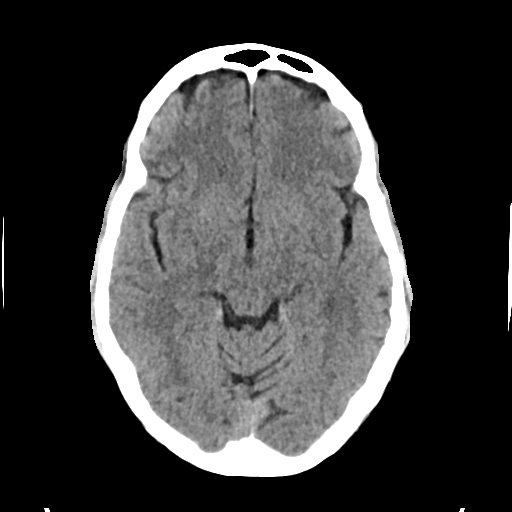
[im 16/32  brain]
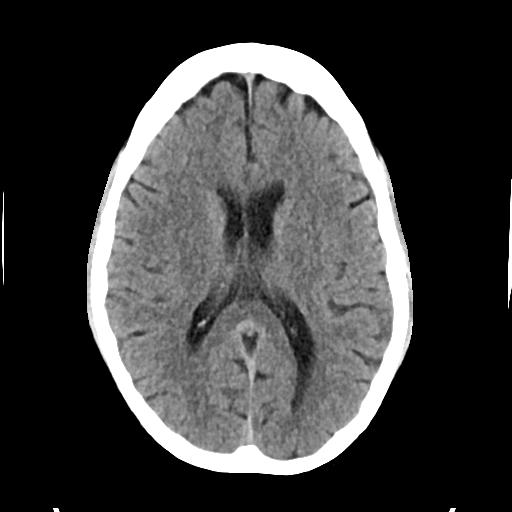
[im 20/32  brain]
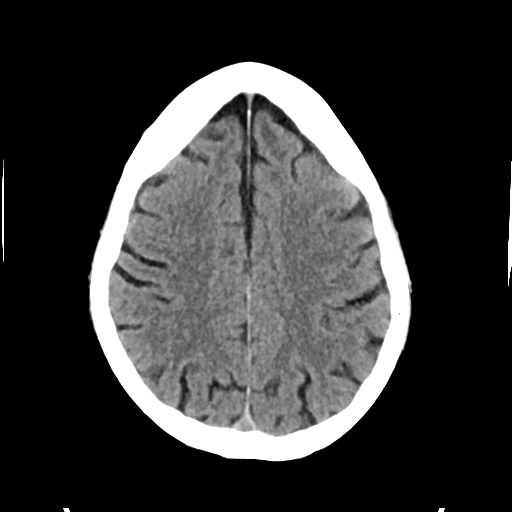
[im 20/32  bone]
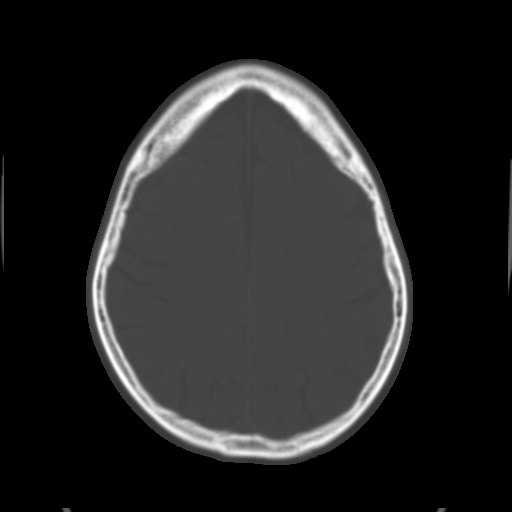
[im 24/32  brain]
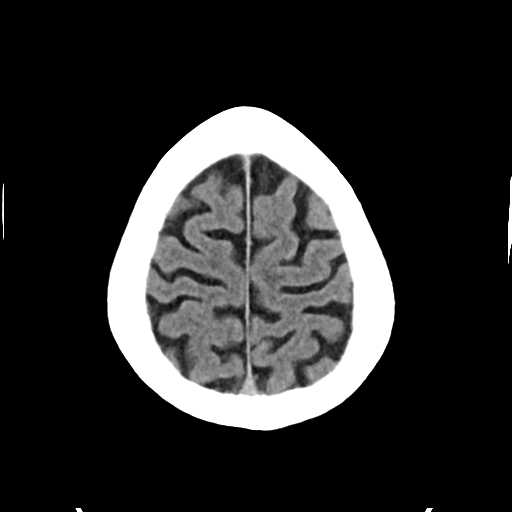
[im 28/32  brain]
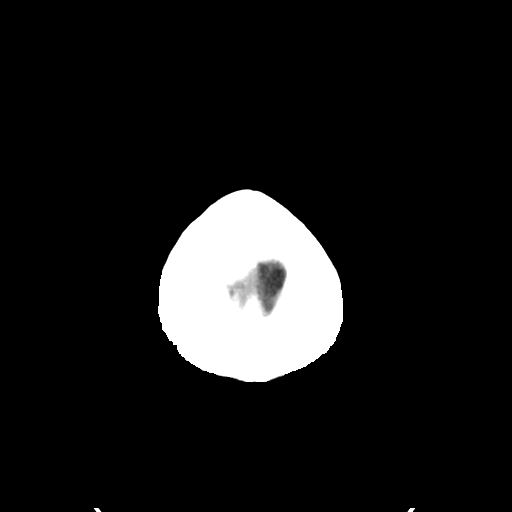

[Series 4: head bone · axial · 0.44mm/px · z∈[-142,-88]mm · 4 of 78 slices shown]
[im 8/78  bone]
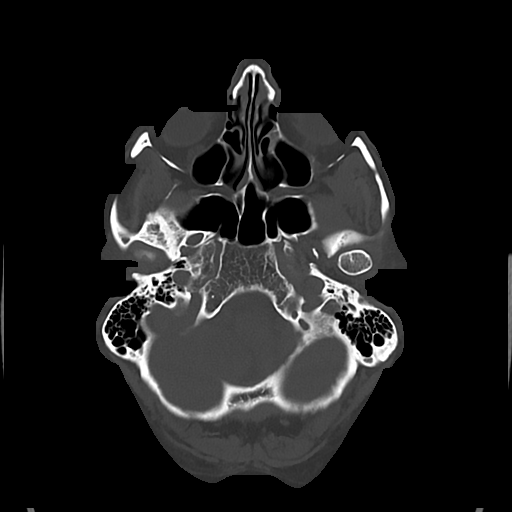
[im 16/78  bone]
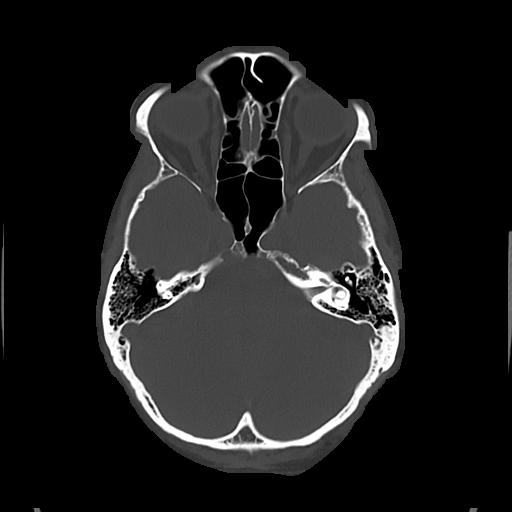
[im 24/78  bone]
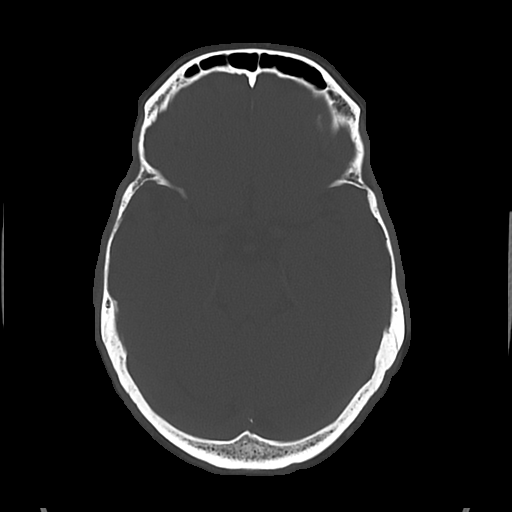
[im 35/78  bone]
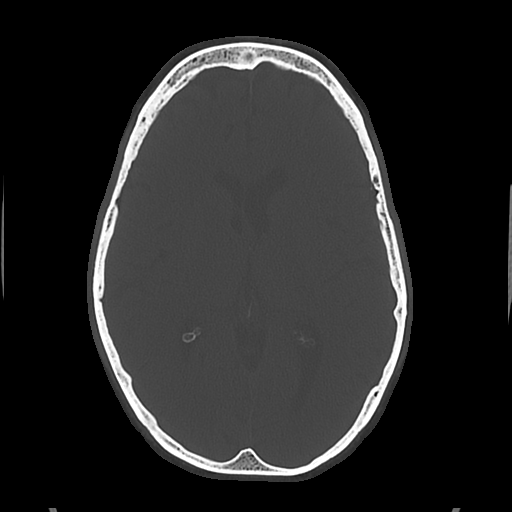

[Series 5: cor soft · coronal · 0.29mm/px · 3 of 71 slices shown]
[im 24/71  brain]
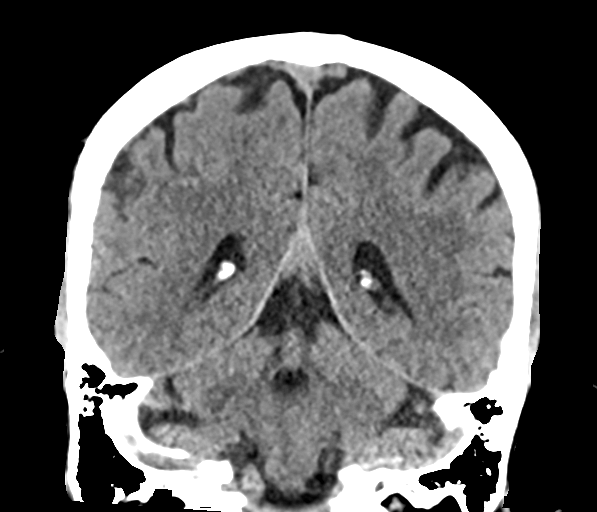
[im 32/71  brain]
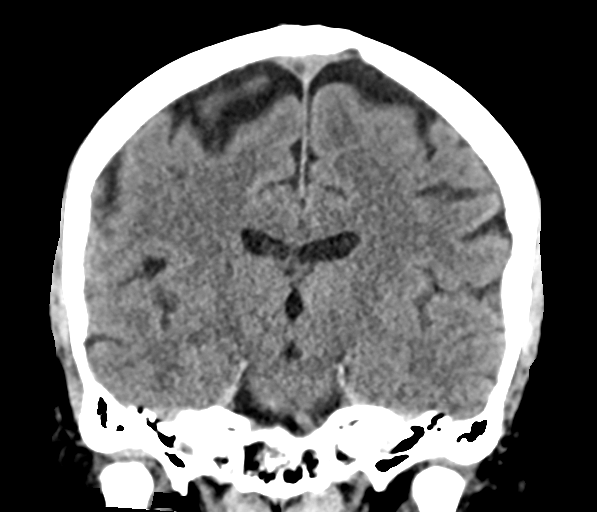
[im 39/71  brain]
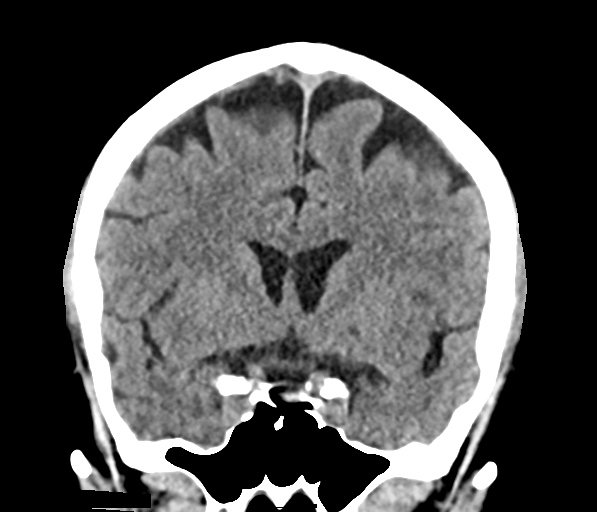

[Series 6: sag soft · sagittal · 0.33mm/px · 3 of 59 slices shown]
[im 20/59  brain]
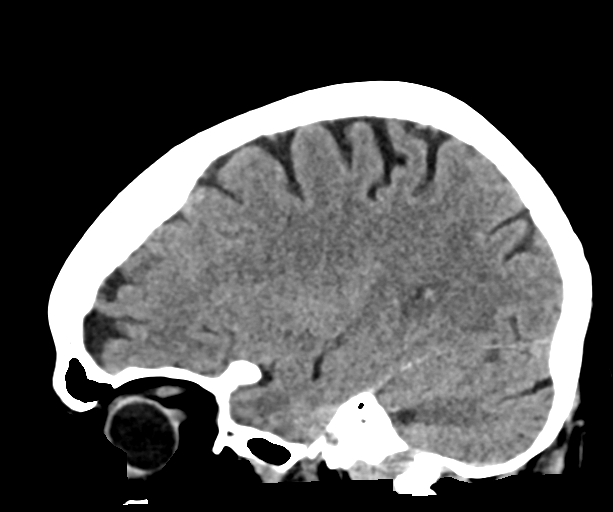
[im 30/59  brain]
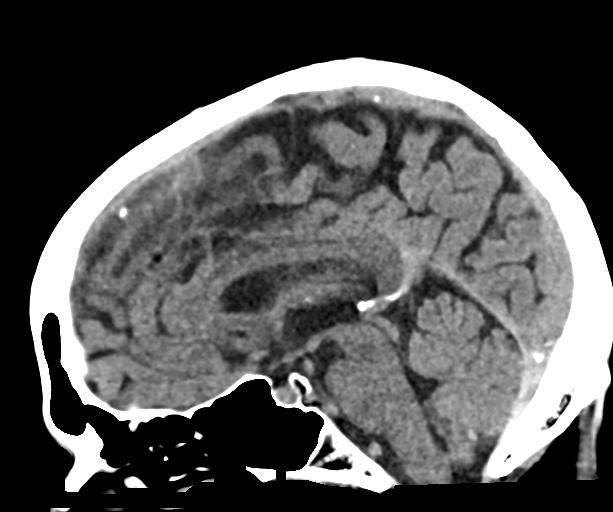
[im 39/59  brain]
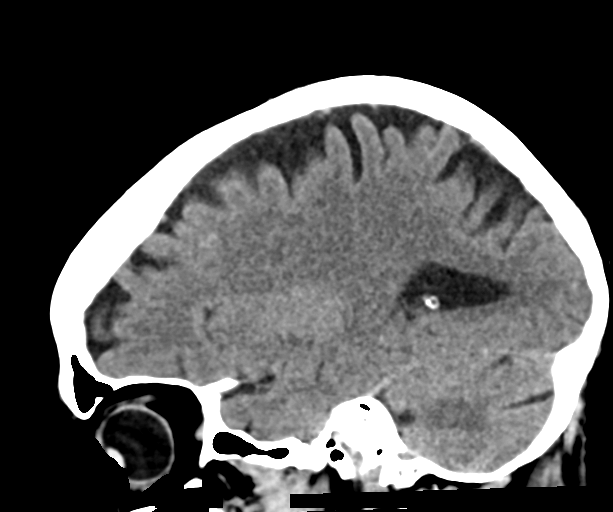

[17 of 47 positions shown; findings below may reference images not displayed]

FINDINGS: Brain: No evidence of acute infarction, hemorrhage, hydrocephalus,
extra-axial collection or mass lesion/mass effect.

Vascular: No hyperdense vessel or unexpected calcification.

Skull: Normal. Negative for fracture or focal lesion.

Sinuses/Orbits: Negative.

Other: None.
IMPRESSION: Normal head CT.

## 2021-07-20 DIAGNOSIS — E1122 Type 2 diabetes mellitus with diabetic chronic kidney disease: Secondary | ICD-10-CM | POA: Diagnosis not present

## 2021-07-20 DIAGNOSIS — E785 Hyperlipidemia, unspecified: Secondary | ICD-10-CM | POA: Diagnosis not present

## 2021-07-20 DIAGNOSIS — I129 Hypertensive chronic kidney disease with stage 1 through stage 4 chronic kidney disease, or unspecified chronic kidney disease: Secondary | ICD-10-CM | POA: Diagnosis not present

## 2021-07-20 DIAGNOSIS — N1832 Chronic kidney disease, stage 3b: Secondary | ICD-10-CM | POA: Diagnosis not present

## 2021-07-28 ENCOUNTER — Other Ambulatory Visit: Payer: Self-pay

## 2021-07-28 ENCOUNTER — Ambulatory Visit: Payer: Medicare Other | Admitting: Cardiology

## 2021-07-28 ENCOUNTER — Encounter: Payer: Self-pay | Admitting: Cardiology

## 2021-07-28 VITALS — BP 120/64 | HR 60 | Temp 97.3°F | Resp 16 | Ht 71.0 in | Wt 208.0 lb

## 2021-07-28 DIAGNOSIS — I129 Hypertensive chronic kidney disease with stage 1 through stage 4 chronic kidney disease, or unspecified chronic kidney disease: Secondary | ICD-10-CM | POA: Diagnosis not present

## 2021-07-28 DIAGNOSIS — I10 Essential (primary) hypertension: Secondary | ICD-10-CM

## 2021-07-28 DIAGNOSIS — R809 Proteinuria, unspecified: Secondary | ICD-10-CM | POA: Diagnosis not present

## 2021-07-28 DIAGNOSIS — D485 Neoplasm of uncertain behavior of skin: Secondary | ICD-10-CM | POA: Diagnosis not present

## 2021-07-28 DIAGNOSIS — D044 Carcinoma in situ of skin of scalp and neck: Secondary | ICD-10-CM | POA: Diagnosis not present

## 2021-07-28 DIAGNOSIS — E782 Mixed hyperlipidemia: Secondary | ICD-10-CM | POA: Diagnosis not present

## 2021-07-28 DIAGNOSIS — Z85828 Personal history of other malignant neoplasm of skin: Secondary | ICD-10-CM | POA: Diagnosis not present

## 2021-07-28 DIAGNOSIS — E1122 Type 2 diabetes mellitus with diabetic chronic kidney disease: Secondary | ICD-10-CM | POA: Diagnosis not present

## 2021-07-28 DIAGNOSIS — Q613 Polycystic kidney, unspecified: Secondary | ICD-10-CM

## 2021-07-28 DIAGNOSIS — C44719 Basal cell carcinoma of skin of left lower limb, including hip: Secondary | ICD-10-CM | POA: Diagnosis not present

## 2021-07-28 DIAGNOSIS — N1832 Chronic kidney disease, stage 3b: Secondary | ICD-10-CM

## 2021-07-28 MED ORDER — KERENDIA 10 MG PO TABS: 1.0000 | ORAL_TABLET | Freq: Every day | ORAL | 1 refills | Status: DC

## 2021-07-28 NOTE — Progress Notes (Signed)
Primary Physician/Referring:  Ginger Organ., MD  Patient ID: Joseph Nguyen, male    DOB: [birth year], 71 y.o.   MRN: [id number]  Chief Complaint  Patient presents with   Hypertension   HPI:    Joseph Nguyen  is a 71 y.o. male  with hypertension, hyperlipidemia, and controlled diabetes. He also has h/o asymptomatic multiple cysts in the kidney has slightly progressed with recent renal ultrasound, proteinuria, presents to establish care, had last seen 2 years ago for management of hypertension.  Recently had taken blood pressure measurements at home and felt that his pressure was high, he was started on chlorthalidone, patient felt very weak and tired and thought that his heart rate was also low and discontinued this 4 days ago. He is presently asymptomatic.  Past Medical History:  Diagnosis Date   Controlled type 2 diabetes mellitus without complication ([place]) 11/17/3660   DM (diabetes mellitus) ([place])    Essential hypertension 07/27/2011   Hiatal hernia    Hypercholesterolemia    Kidney disease    Polycystic,    Mixed hyperlipidemia 11/05/2018   Obesity    Polycystic kidney disease 11/05/2018   Past Surgical History:  Procedure Laterality Date   CARDIOVASCULAR STRESS TEST  10-18-2005   EF 79%   TUMOR REMOVAL  1968   Left Breast Benign   Family History  Problem Relation Age of Onset   Cancer Mother    Stroke Sister 28       3 Total, all within a 1 year and half   Diabetes Brother    Chronic Renal Failure Brother     Social History   Tobacco Use   Smoking status: Former    Packs/day: 0.25    Years: 5.00    Pack years: 1.25    Types: Cigarettes    Quit date: 02/25/1979    Years since quitting: 42.4   Smokeless tobacco: Never   Tobacco comments:    Quite 74yr+ ago  Substance Use Topics   Alcohol use: Yes    Comment: occasionally   Marital Status: Married  ROS  Review of Systems  Cardiovascular:  Negative for chest pain, dyspnea on exertion and syncope.   Objective  Blood pressure 120/64, pulse 60, temperature (!) 97.3 F (36.3 C), temperature source Temporal, resp. rate 16, height 5' 11"  (1.803 m), weight 208 lb (94.3 kg), SpO2 97 %.  Vitals with BMI 07/28/2021 07/28/2021 07/26/2020  Height - 5' 11"  -  Weight - 208 lbs -  BMI - 294.76-  Systolic [id number] Diastolic 64 47 70  Pulse - 60 57     Physical Exam Constitutional:      Appearance: He is well-developed.  Neck:     Vascular: No JVD.  Cardiovascular:     Rate and Rhythm: Normal rate and regular rhythm.     Pulses: Normal pulses and intact distal pulses.     Heart sounds: No murmur heard.   No gallop.  Pulmonary:     Effort: Pulmonary effort is normal. No accessory muscle usage or respiratory distress.     Breath sounds: Normal breath sounds.  Abdominal:     General: Bowel sounds are normal.     Palpations: Abdomen is soft.  Musculoskeletal:     Right lower leg: No edema.     Left lower leg: No edema.   Laboratory examination:   External labs:   Labs 06/29/2021:  Urinary microalbumin 254, urine 30 albumin/creatinine ratio  267.9, markedly abnormal.  BUN 28, creatinine 1.6, EGFR 42 mL, potassium 4.6, serum glucose 135 mg, LFTs normal.  Hb 12.8/HCT 38.4, platelets 122.  Normal indicis.  Total cholesterol 141, triglycerides 107, HDL 29, LDL 91.  TSH normal at 2.12, A1c 6.6%.  Glucose Random 146.000 M 07/18/2019  BUN 30.000 M 07/18/2019 Creatinine, Serum 1.930 MG/ 07/18/2019  Medications and allergies   Allergies  Allergen Reactions   Amlodipine Other (See Comments)    Edema- ankles   Hydralazine Other (See Comments)    Chest pain and weakness   Jardiance [Empagliflozin] Nausea Only and Other (See Comments)    Was taken off of Metformin because of Polycystic kidney disease (taken successfully for 4-5 years) and has been experiencing severe listlessness, in addition to nausea   Zocor [Simvastatin - High Dose] Other (See Comments)    Myalgias      Current  Outpatient Medications:    acetaminophen (TYLENOL) 325 MG tablet, Take 325-650 mg by mouth every 8 (eight) hours as needed for mild pain (or headaches)., Disp: , Rfl:    aspirin EC 81 MG tablet, Take 81 mg by mouth daily., Disp: , Rfl:    carvedilol (COREG) 12.5 MG tablet, Take 12.5 mg by mouth 2 (two) times daily with a meal., Disp: , Rfl:    chlorthalidone (HYGROTON) 25 MG tablet, Take 12.5 tablets by mouth daily., Disp: , Rfl:    cholecalciferol (VITAMIN D3) 25 MCG (1000 UT) tablet, Take 1,000 Units by mouth daily., Disp: , Rfl:    Coenzyme Q10 (COQ10) 100 MG CAPS, Take 100 mg by mouth daily., Disp: , Rfl:    Finerenone (KERENDIA) 10 MG TABS, Take 1 tablet by mouth daily., Disp: 90 tablet, Rfl: 1   fluticasone (FLONASE) 50 MCG/ACT nasal spray, Place 2 sprays into both nostrils daily as needed for allergies or rhinitis., Disp: , Rfl:    multivitamin-lutein (OCUVITE-LUTEIN) CAPS capsule, Take 1 capsule by mouth daily., Disp: , Rfl:    olmesartan (BENICAR) 40 MG tablet, Take 1 tablet by mouth daily., Disp: , Rfl:    Omega-3 Fatty Acids (FISH OIL) 1000 MG CAPS, Take 1,000 mg by mouth daily., Disp: , Rfl:    omeprazole (PRILOSEC) 40 MG capsule, Take 40 mg by mouth daily before breakfast., Disp: , Rfl:    rosuvastatin (CRESTOR) 10 MG tablet, take 1 tablet by mouth once daily (Patient taking differently: Take 10 mg by mouth at bedtime.), Disp: 15 tablet, Rfl: 0   Semaglutide,0.25 or 0.5MG/DOS, (OZEMPIC, 0.25 OR 0.5 MG/DOSE,) 2 MG/1.5ML SOPN, Inject 0.5 mg into the skin every Wednesday., Disp: , Rfl:    vitamin B-12 (CYANOCOBALAMIN) 1000 MCG tablet, Take 1,000 mcg by mouth daily., Disp: , Rfl:    vitamin C (ASCORBIC ACID) 500 MG tablet, Take 500 mg by mouth daily., Disp: , Rfl:    vitamin E 100 UNIT capsule, Take 100 Units by mouth daily., Disp: , Rfl:    Radiology:   Chest x-ray PA and lateral view normal.  Ultrasound of the abdomen 07/10/2019 revealed bilateral mild cystic disease of the kidney,  findings suggestive of polycystic renal disease on cystic renal disease in the setting of renal failure.  No definite hydronephrosis bilaterally.  US Abdominal Aorta 01/06/2014: No aortic aneurysm. Abdominal aorta measures 2.3 x 2.5 cm in diameter.  Cardiac Studies:   Lexiscan Myoview stress test 11/10/2018 [facility]):  No evidence of ischemia, normal LVEF.  Low risk study.  Echocardiogram 06/18/2019:  Normal LV systolic function with  EF 66%. Left ventricle cavity is normal in size. Normal global wall motion. Normal diastolic filling pattern.  Calculated EF 66%.  Left atrial cavity is mildly dilated at 4.0 cm and 51 mL.  Normal interatrial septum.  Structurally normal tricuspid valve.  Mild tricuspid regurgitation. No evidence of pulmonary hypertension.  Structurally normal pulmonic valve.  Mild pulmonic regurgitation.  EKG    EKG 07/28/2021: Normal sinus rhythm with rate of 63 bpm, normal axis, incomplete right bundle branch block.  No evidence of ischemia.  Low-voltage complexes.  No significant change from 06/17/2019.  Assessment     ICD-10-CM   1. Essential hypertension  I10 EKG 12-Lead    2. Type 2 diabetes mellitus with stage 3b chronic kidney disease, without long-term current use of insulin (HCC)  E11.22 Finerenone (KERENDIA) 10 MG TABS   N18.32 Ambulatory referral to Nutrition and Diabetic Education    3. Asymptomatic proteinuria  R80.9 Finerenone (KERENDIA) 10 MG TABS    4. Mixed hyperlipidemia  E78.2     5. Polycystic kidney disease  Q61.3        Meds ordered this encounter  Medications   Finerenone (KERENDIA) 10 MG TABS    Sig: Take 1 tablet by mouth daily.    Dispense:  90 tablet    Refill:  1    Medications Discontinued During This Encounter  Medication Reason   empagliflozin (JARDIANCE) 10 MG TABS tablet Side effect (s)   labetalol (NORMODYNE) 100 MG tablet    olmesartan-hydrochlorothiazide (BENICAR HCT) 20-12.5 MG tablet Change in therapy    POLY-IRON 150 150 MG capsule     Recommendations:   Joseph Nguyen  is a 71 y.o. male  with hypertension, hyperlipidemia, and controlled diabetes. He also has h/o asymptomatic multiple cysts in the kidney has slightly progressed with recent renal ultrasound, proteinuria, presents to establish care, had last seen 2 years ago for management of hypertension.  Recently had taken blood pressure measurements at home and felt that his pressure was high, he was started on chlorthalidone, patient felt very weak and tired and thought that his heart rate was also low and discontinued this 4 days ago.  I advised him that his blood pressure is normal and the home recordings are clearly disproportionately high and the patient get a new blood pressure monitor.  Previously he has checked his 44s 10 points higher or more.  As his blood pressure is well controlled on carvedilol and also olmesartan, I recommended that he continue the same.  I also advised him not to check his blood pressure incessantly.  His lipids are well controlled.  External labs reviewed.  He has diabetes mellitus with stage IIIb chronic kidney disease and also has polycystic kidney disease, and he is spilling significant amount of protein as well, he would be an excellent candidate for Carrington Clamp to be tried upon.  I sent in the prescription after discussions with Dr. Marval Regal.  You will need follow-up BMP in 2 to 3 weeks.  We could certainly uptitrate the medication to 25 mg if serum creatinine does not change significantly and labs are stable.  We discussed regarding weight management, patient is on Ozempic, advised him that with lifestyle modification he could certainly come off of most of the medications that he is on and weight gain is related to poor diet and poor habits.  I have made referral for wellness clinic and nutritional center upon the request.  Otherwise from cardiac standpoint I do not see any abnormalities  by physical exam, he  remains asymptomatic, has been physically active without chest pain or dyspnea, hence I will see him back on a as needed basis.  This was a 50-minute office visit encounter.  CC Drs. Cindy Hazy, MD, [facility] 07/28/2021, 2:34 PM Office: [phone] Fax: [phone] Pager: [phone]

## 2021-07-29 DIAGNOSIS — N183 Chronic kidney disease, stage 3 unspecified: Secondary | ICD-10-CM | POA: Diagnosis not present

## 2021-07-29 DIAGNOSIS — I10 Essential (primary) hypertension: Secondary | ICD-10-CM | POA: Diagnosis not present

## 2021-07-31 DIAGNOSIS — J029 Acute pharyngitis, unspecified: Secondary | ICD-10-CM | POA: Diagnosis not present

## 2021-07-31 DIAGNOSIS — M791 Myalgia, unspecified site: Secondary | ICD-10-CM | POA: Diagnosis not present

## 2021-08-11 DIAGNOSIS — D044 Carcinoma in situ of skin of scalp and neck: Secondary | ICD-10-CM | POA: Diagnosis not present

## 2021-08-11 DIAGNOSIS — C44719 Basal cell carcinoma of skin of left lower limb, including hip: Secondary | ICD-10-CM | POA: Diagnosis not present

## 2021-08-11 DIAGNOSIS — Z85828 Personal history of other malignant neoplasm of skin: Secondary | ICD-10-CM | POA: Diagnosis not present

## 2021-08-16 ENCOUNTER — Other Ambulatory Visit: Payer: Self-pay

## 2021-08-16 DIAGNOSIS — R809 Proteinuria, unspecified: Secondary | ICD-10-CM

## 2021-08-16 DIAGNOSIS — E1122 Type 2 diabetes mellitus with diabetic chronic kidney disease: Secondary | ICD-10-CM

## 2021-08-16 MED ORDER — KERENDIA 10 MG PO TABS: 1.0000 | ORAL_TABLET | Freq: Every day | ORAL | 2 refills | Status: DC

## 2021-08-20 DIAGNOSIS — N1832 Chronic kidney disease, stage 3b: Secondary | ICD-10-CM | POA: Diagnosis not present

## 2021-08-20 DIAGNOSIS — E785 Hyperlipidemia, unspecified: Secondary | ICD-10-CM | POA: Diagnosis not present

## 2021-08-20 DIAGNOSIS — I129 Hypertensive chronic kidney disease with stage 1 through stage 4 chronic kidney disease, or unspecified chronic kidney disease: Secondary | ICD-10-CM | POA: Diagnosis not present

## 2021-08-20 DIAGNOSIS — E1122 Type 2 diabetes mellitus with diabetic chronic kidney disease: Secondary | ICD-10-CM | POA: Diagnosis not present

## 2021-09-09 DIAGNOSIS — Q612 Polycystic kidney, adult type: Secondary | ICD-10-CM | POA: Diagnosis not present

## 2021-09-09 DIAGNOSIS — E119 Type 2 diabetes mellitus without complications: Secondary | ICD-10-CM | POA: Diagnosis not present

## 2021-09-09 DIAGNOSIS — N1832 Chronic kidney disease, stage 3b: Secondary | ICD-10-CM | POA: Diagnosis not present

## 2021-09-09 DIAGNOSIS — I129 Hypertensive chronic kidney disease with stage 1 through stage 4 chronic kidney disease, or unspecified chronic kidney disease: Secondary | ICD-10-CM | POA: Diagnosis not present

## 2021-09-09 DIAGNOSIS — Z79899 Other long term (current) drug therapy: Secondary | ICD-10-CM | POA: Diagnosis not present

## 2021-09-27 DIAGNOSIS — I129 Hypertensive chronic kidney disease with stage 1 through stage 4 chronic kidney disease, or unspecified chronic kidney disease: Secondary | ICD-10-CM | POA: Diagnosis not present

## 2021-09-27 DIAGNOSIS — N183 Chronic kidney disease, stage 3 unspecified: Secondary | ICD-10-CM | POA: Diagnosis not present

## 2021-09-27 DIAGNOSIS — D638 Anemia in other chronic diseases classified elsewhere: Secondary | ICD-10-CM | POA: Diagnosis not present

## 2021-09-27 DIAGNOSIS — E1122 Type 2 diabetes mellitus with diabetic chronic kidney disease: Secondary | ICD-10-CM | POA: Diagnosis not present

## 2021-09-27 DIAGNOSIS — N2581 Secondary hyperparathyroidism of renal origin: Secondary | ICD-10-CM | POA: Diagnosis not present

## 2021-10-11 ENCOUNTER — Encounter: Payer: Medicare Other | Attending: Cardiology | Admitting: Dietician

## 2021-10-11 ENCOUNTER — Encounter: Payer: Self-pay | Admitting: Dietician

## 2021-10-11 DIAGNOSIS — N1832 Chronic kidney disease, stage 3b: Secondary | ICD-10-CM | POA: Insufficient documentation

## 2021-10-11 DIAGNOSIS — E1122 Type 2 diabetes mellitus with diabetic chronic kidney disease: Secondary | ICD-10-CM | POA: Insufficient documentation

## 2021-10-11 DIAGNOSIS — Z713 Dietary counseling and surveillance: Secondary | ICD-10-CM | POA: Diagnosis not present

## 2021-10-11 DIAGNOSIS — E119 Type 2 diabetes mellitus without complications: Secondary | ICD-10-CM

## 2021-10-11 NOTE — Patient Instructions (Signed)
You have been following a low potassium diet due to your blood potassium being high in the past.  Do not avoid potassium foods but choose from the low potassium list.  Occasional small portion of higher potassium choice is acceptable - for instance a slice of tomato. You may choose 5 servings of vegetables and fruits total per day.  Continue to choose low sodium foods and avoid added salt.  Continue to be mindful to choose small portions of meat or choose a meatless meal.  Avoid foods that have Phos... on the ingredient list.  This is 100% absorbed.   Continue to stay active.  Resources: [email or web address] [email or web address]

## 2021-10-11 NOTE — Progress Notes (Signed)
Diabetes Self-Management Education  Visit Type: First/Initial  Appt. Start Time: 1405 Appt. End Time: 2536  10/11/2021  [name], identified by name and date of birth, is a 71 y.o. male with a diagnosis of Diabetes: Type 2.   ASSESSMENT Patient is here today alone.  He was last seen by this RD on 07/10/2019 Referral diagnosis:  Type 2 Diabetes with stage 3b CKD He would like to clarify what he should eat due to diabetes, kidney and blood pressure concerns. He has been limiting potassium for about 5 months. He recently returned from a cruise in [place].    History includes Type 2 Diabetes with stage 3b CKD, Polycystic Kidney Disease, HTN, HLD Labs noted to include:  BUN 20, Creatinine, 1.61, Potassium 4.9, alkaline phosphatase 111, estimated GFR 45, urine protein 30 and vitamin D 32 on 09/09/2021 (GFR improved from 27 07/2020) A1C 6.6% 06/2021 Medications include:  ozempic, vitamin B-12, CoQ-10, MVI, omega-3, vitamin C, vitamin E, iron  Weight hx: 212 lbs 10/11/2021 207 lbs 07/10/2019 Gained weight on recent cruise.   Patient lives with his wife.  He is a retired Engineer, maintenance (IT).  They have been eating many plant based meals and ask specifically for lunch ideas. Goes on cruises frequently. [place].  Weight 212 lb (96.2 kg). Body mass index is 29.57 kg/m.   Diabetes Self-Management Education - 10/11/21 1422       Visit Information   Visit Type First/Initial      Initial Visit   Diabetes Type Type 2    Date Diagnosed 1998    Are you currently following a meal plan? Yes    Are you taking your medications as prescribed? Yes      Health Coping   How would you rate your overall health? Good      Psychosocial Assessment   Patient Belief/Attitude about Diabetes Motivated to manage diabetes    What is the hardest part about your diabetes right now, causing you the most concern, or is the most worrisome to you about your diabetes?   Other (comment)   how do i control  it   Self-care barriers None    Self-management support Doctor's office    Other persons present Patient    Patient Concerns Nutrition/Meal planning    Special Needs None    Preferred Learning Style No preference indicated    Learning Readiness Ready    What is the last grade level you completed in school? college degree      Pre-Education Assessment   Patient understands the diabetes disease and treatment process. Demonstrates understanding / competency    Patient understands incorporating nutritional management into lifestyle. Needs Review    Patient undertands incorporating physical activity into lifestyle. Demonstrates understanding / competency    Patient understands using medications safely. Demonstrates understanding / competency    Patient understands monitoring blood glucose, interpreting and using results Demonstrates understanding / competency    Patient understands prevention, detection, and treatment of acute complications. Demonstrates understanding / competency    Patient understands prevention, detection, and treatment of chronic complications. Demonstrates understanding / competency    Patient understands how to develop strategies to address psychosocial issues. Needs Review    Patient understands how to develop strategies to promote health/change behavior. Needs Review      Complications   Last HgB A1C per patient/outside source 6.6 %   06/2021   How often do you check your blood sugar? 1-2 times/day    Fasting  Blood glucose range (mg/dL) 70-129    Number of hypoglycemic episodes per month 0    Have you had a dilated eye exam in the past 12 months? Yes    Have you had a dental exam in the past 12 months? Yes    Are you checking your feet? Yes    How many days per week are you checking your feet? 3      Dietary Intake   Breakfast cerea with almond milkl, toast with PB and jelly or margarine OR egg and toast with margarine    Snack (morning) occasional NABS or nuts or  raisins or mini candy bar when playing golf    Lunch sandwich (lunch meat or cheese or PB), water, apple or banana    Snack (afternoon) occasional nuts or PB or Cheese crackers    Dinner meat, 2 vegetables, salad    Snack (evening) popcorn (skinny pop)    Beverage(s) black coffee, water, occasional diet soda (clear), almond milk, occasional skim milk      Activity / Exercise   Activity / Exercise Type Moderate (swimming / aerobic walking)    How many days per week do you exercise? 5    How many minutes per day do you exercise? 60    Total minutes per week of exercise 300      Patient Education   Previous Diabetes Education Yes (please comment)   2021   Healthy Eating Other (comment)   reviewed guidelines for a renal diet appropriate for diabetes   Being Active Other (comment)   encouraged continued exercise   Medications Reviewed patients medication for diabetes, action, purpose, timing of dose and side effects.    Monitoring Identified appropriate SMBG and/or A1C goals.    Diabetes Stress and Support Identified and addressed patients feelings and concerns about diabetes      Individualized Goals (developed by patient)   Nutrition General guidelines for healthy choices and portions discussed    Physical Activity Exercise 3-5 times per week;60 minutes per day    Medications take my medication as prescribed    Monitoring  Test my blood glucose as discussed    Problem Solving Eating Pattern      Post-Education Assessment   Patient understands the diabetes disease and treatment process. Demonstrates understanding / competency    Patient understands incorporating nutritional management into lifestyle. Comprehends key points    Patient undertands incorporating physical activity into lifestyle. Demonstrates understanding / competency    Patient understands using medications safely. Demonstrates understanding / competency    Patient understands monitoring blood glucose, interpreting and  using results Comprehends key points    Patient understands prevention, detection, and treatment of acute complications. Demonstrates understanding / competency    Patient understands prevention, detection, and treatment of chronic complications. Demonstrates understanding / competency    Patient understands how to develop strategies to address psychosocial issues. Comprehends key points    Patient understands how to develop strategies to promote health/change behavior. Comprehends key points      Outcomes   Expected Outcomes Demonstrated interest in learning. Expect positive outcomes    Future DMSE PRN    Program Status Completed             Individualized Plan for Diabetes Self-Management Training:   Learning Objective:  Patient will have a greater understanding of diabetes self-management. Patient education plan is to attend individual and/or group sessions per assessed needs and concerns.   Plan:   Patient Instructions  You have been following a low potassium diet due to your blood potassium being high in the past.  Do not avoid potassium foods but choose from the low potassium list.  Occasional small portion of higher potassium choice is acceptable - for instance a slice of tomato. You may choose 5 servings of vegetables and fruits total per day.  Continue to choose low sodium foods and avoid added salt.  Continue to be mindful to choose small portions of meat or choose a meatless meal.  Avoid foods that have Phos... on the ingredient list.  This is 100% absorbed.   Continue to stay active.  Resources: [email or web address].org [facility]   Expected Outcomes:  Demonstrated interest in learning. Expect positive outcomes  Education material provided: [facility] national kidney diet placemat  If problems or questions, patient to contact team via:  Phone  Future DSME appointment: PRN

## 2021-10-14 DIAGNOSIS — R051 Acute cough: Secondary | ICD-10-CM | POA: Diagnosis not present

## 2021-10-14 DIAGNOSIS — J019 Acute sinusitis, unspecified: Secondary | ICD-10-CM | POA: Diagnosis not present

## 2021-11-04 ENCOUNTER — Other Ambulatory Visit: Payer: Self-pay | Admitting: Family Medicine

## 2021-11-04 ENCOUNTER — Ambulatory Visit (HOSPITAL_BASED_OUTPATIENT_CLINIC_OR_DEPARTMENT_OTHER)
Admission: RE | Admit: 2021-11-04 | Discharge: 2021-11-04 | Disposition: A | Discharge status: Home / Self Care | Payer: Medicare Other | Source: Ambulatory Visit | Attending: Family Medicine | Admitting: Family Medicine

## 2021-11-04 ENCOUNTER — Other Ambulatory Visit (HOSPITAL_COMMUNITY): Payer: Self-pay | Admitting: Family Medicine

## 2021-11-04 DIAGNOSIS — N200 Calculus of kidney: Secondary | ICD-10-CM | POA: Diagnosis not present

## 2021-11-04 DIAGNOSIS — K5790 Diverticulosis of intestine, part unspecified, without perforation or abscess without bleeding: Secondary | ICD-10-CM | POA: Diagnosis not present

## 2021-11-04 DIAGNOSIS — R1030 Lower abdominal pain, unspecified: Secondary | ICD-10-CM | POA: Diagnosis not present

## 2021-11-04 DIAGNOSIS — K5730 Diverticulosis of large intestine without perforation or abscess without bleeding: Secondary | ICD-10-CM | POA: Diagnosis not present

## 2021-11-04 DIAGNOSIS — K760 Fatty (change of) liver, not elsewhere classified: Secondary | ICD-10-CM | POA: Diagnosis not present

## 2021-11-04 MED ORDER — IOHEXOL 300 MG/ML  SOLN
100.0000 mL | Freq: Once | INTRAMUSCULAR | Status: AC | PRN
  Administered 2021-11-04: 100 mL via INTRAVENOUS

## 2021-11-08 DIAGNOSIS — R1030 Lower abdominal pain, unspecified: Secondary | ICD-10-CM | POA: Diagnosis not present

## 2021-11-08 DIAGNOSIS — R194 Change in bowel habit: Secondary | ICD-10-CM | POA: Diagnosis not present

## 2021-11-08 DIAGNOSIS — K5790 Diverticulosis of intestine, part unspecified, without perforation or abscess without bleeding: Secondary | ICD-10-CM | POA: Diagnosis not present

## 2021-11-08 DIAGNOSIS — N200 Calculus of kidney: Secondary | ICD-10-CM | POA: Diagnosis not present

## 2021-11-08 DIAGNOSIS — Q612 Polycystic kidney, adult type: Secondary | ICD-10-CM | POA: Diagnosis not present

## 2021-11-08 DIAGNOSIS — K55069 Acute infarction of intestine, part and extent unspecified: Secondary | ICD-10-CM | POA: Diagnosis not present

## 2021-11-22 DIAGNOSIS — R109 Unspecified abdominal pain: Secondary | ICD-10-CM | POA: Diagnosis not present

## 2021-11-24 ENCOUNTER — Other Ambulatory Visit: Payer: Self-pay | Admitting: Gastroenterology

## 2021-11-24 DIAGNOSIS — R109 Unspecified abdominal pain: Secondary | ICD-10-CM

## 2021-12-21 ENCOUNTER — Ambulatory Visit
Admission: RE | Admit: 2021-12-21 | Discharge: 2021-12-21 | Disposition: A | Discharge status: Home / Self Care | Payer: Medicare Other | Source: Ambulatory Visit | Attending: Gastroenterology | Admitting: Gastroenterology

## 2021-12-21 DIAGNOSIS — K5900 Constipation, unspecified: Secondary | ICD-10-CM | POA: Diagnosis not present

## 2021-12-21 DIAGNOSIS — R109 Unspecified abdominal pain: Secondary | ICD-10-CM

## 2021-12-29 DIAGNOSIS — E1129 Type 2 diabetes mellitus with other diabetic kidney complication: Secondary | ICD-10-CM | POA: Diagnosis not present

## 2021-12-29 DIAGNOSIS — N1832 Chronic kidney disease, stage 3b: Secondary | ICD-10-CM | POA: Diagnosis not present

## 2021-12-29 DIAGNOSIS — E669 Obesity, unspecified: Secondary | ICD-10-CM | POA: Diagnosis not present

## 2021-12-29 DIAGNOSIS — I700 Atherosclerosis of aorta: Secondary | ICD-10-CM | POA: Diagnosis not present

## 2021-12-29 DIAGNOSIS — I129 Hypertensive chronic kidney disease with stage 1 through stage 4 chronic kidney disease, or unspecified chronic kidney disease: Secondary | ICD-10-CM | POA: Diagnosis not present

## 2021-12-29 DIAGNOSIS — K55069 Acute infarction of intestine, part and extent unspecified: Secondary | ICD-10-CM | POA: Diagnosis not present

## 2021-12-29 DIAGNOSIS — Q612 Polycystic kidney, adult type: Secondary | ICD-10-CM | POA: Diagnosis not present

## 2021-12-29 DIAGNOSIS — D696 Thrombocytopenia, unspecified: Secondary | ICD-10-CM | POA: Diagnosis not present

## 2021-12-29 DIAGNOSIS — E785 Hyperlipidemia, unspecified: Secondary | ICD-10-CM | POA: Diagnosis not present

## 2022-02-14 DIAGNOSIS — L821 Other seborrheic keratosis: Secondary | ICD-10-CM | POA: Diagnosis not present

## 2022-02-14 DIAGNOSIS — L570 Actinic keratosis: Secondary | ICD-10-CM | POA: Diagnosis not present

## 2022-02-14 DIAGNOSIS — Z85828 Personal history of other malignant neoplasm of skin: Secondary | ICD-10-CM | POA: Diagnosis not present

## 2022-02-14 DIAGNOSIS — C4442 Squamous cell carcinoma of skin of scalp and neck: Secondary | ICD-10-CM | POA: Diagnosis not present

## 2022-02-14 DIAGNOSIS — L814 Other melanin hyperpigmentation: Secondary | ICD-10-CM | POA: Diagnosis not present

## 2022-02-14 DIAGNOSIS — D1801 Hemangioma of skin and subcutaneous tissue: Secondary | ICD-10-CM | POA: Diagnosis not present

## 2022-02-16 DIAGNOSIS — N183 Chronic kidney disease, stage 3 unspecified: Secondary | ICD-10-CM | POA: Diagnosis not present

## 2022-02-16 DIAGNOSIS — D638 Anemia in other chronic diseases classified elsewhere: Secondary | ICD-10-CM | POA: Diagnosis not present

## 2022-02-16 DIAGNOSIS — I129 Hypertensive chronic kidney disease with stage 1 through stage 4 chronic kidney disease, or unspecified chronic kidney disease: Secondary | ICD-10-CM | POA: Diagnosis not present

## 2022-02-16 DIAGNOSIS — Q612 Polycystic kidney, adult type: Secondary | ICD-10-CM | POA: Diagnosis not present

## 2022-02-16 DIAGNOSIS — E1122 Type 2 diabetes mellitus with diabetic chronic kidney disease: Secondary | ICD-10-CM | POA: Diagnosis not present

## 2022-02-16 DIAGNOSIS — N2581 Secondary hyperparathyroidism of renal origin: Secondary | ICD-10-CM | POA: Diagnosis not present

## 2022-02-16 DIAGNOSIS — E785 Hyperlipidemia, unspecified: Secondary | ICD-10-CM | POA: Diagnosis not present

## 2022-02-22 DIAGNOSIS — Z23 Encounter for immunization: Secondary | ICD-10-CM | POA: Diagnosis not present

## 2022-04-27 ENCOUNTER — Other Ambulatory Visit (HOSPITAL_BASED_OUTPATIENT_CLINIC_OR_DEPARTMENT_OTHER): Payer: Self-pay

## 2022-04-27 MED ORDER — AREXVY 120 MCG/0.5ML IM SUSR
INTRAMUSCULAR | 0 refills | Status: DC
  Filled 2022-04-27: qty 0.5, 1d supply, fill #0

## 2022-05-19 DIAGNOSIS — M79671 Pain in right foot: Secondary | ICD-10-CM | POA: Diagnosis not present

## 2022-05-27 DIAGNOSIS — R059 Cough, unspecified: Secondary | ICD-10-CM | POA: Diagnosis not present

## 2022-05-27 DIAGNOSIS — I129 Hypertensive chronic kidney disease with stage 1 through stage 4 chronic kidney disease, or unspecified chronic kidney disease: Secondary | ICD-10-CM | POA: Diagnosis not present

## 2022-05-27 DIAGNOSIS — N1832 Chronic kidney disease, stage 3b: Secondary | ICD-10-CM | POA: Diagnosis not present

## 2022-05-27 DIAGNOSIS — J069 Acute upper respiratory infection, unspecified: Secondary | ICD-10-CM | POA: Diagnosis not present

## 2022-06-03 DIAGNOSIS — R319 Hematuria, unspecified: Secondary | ICD-10-CM | POA: Diagnosis not present

## 2022-06-03 DIAGNOSIS — Q612 Polycystic kidney, adult type: Secondary | ICD-10-CM | POA: Diagnosis not present

## 2022-06-08 IMAGING — US US RENAL
1 series · 14 of 25 positions shown · non-contrast
Comparison: July 10, 2019

CLINICAL DATA: Autosomal dominant polycystic kidney disease.
Chronic renal disease.

EXAM:
RENAL / URINARY TRACT ULTRASOUND COMPLETE

[Series 1: us renal · 0.30mm/px · 14 of 50 slices shown]
[im 1/50]
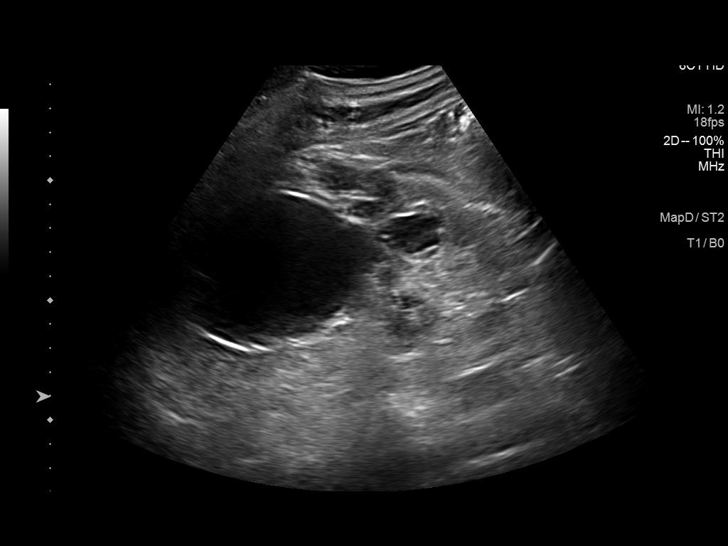
[im 5/50]
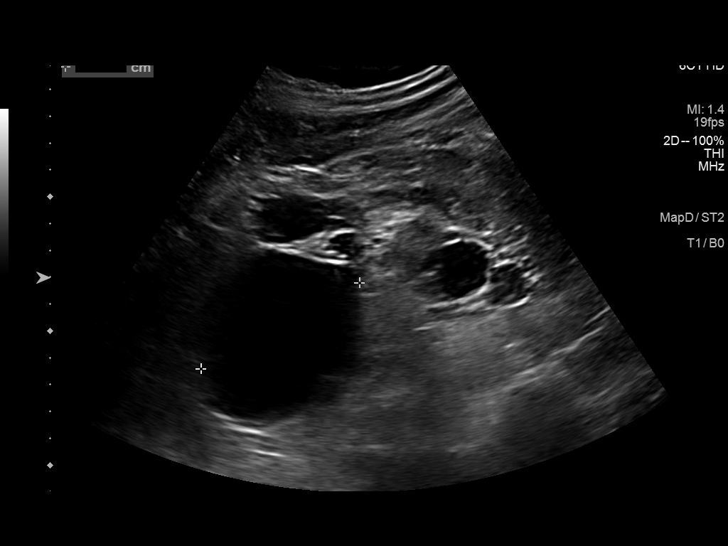
[im 9/50]
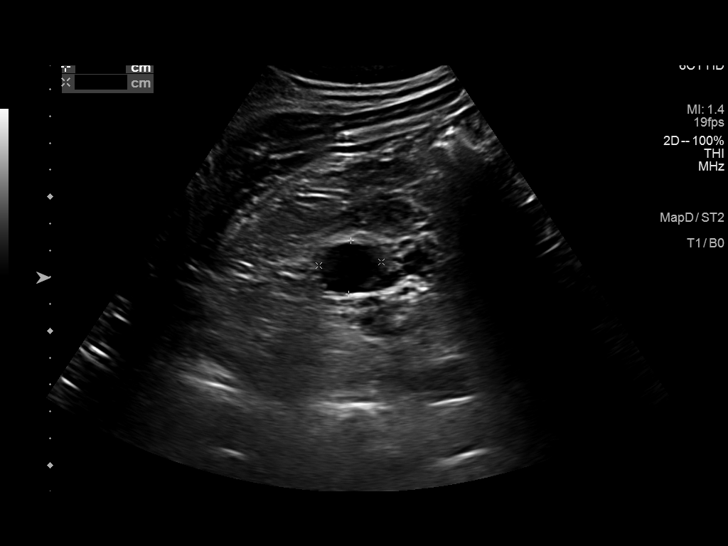
[im 13/50]
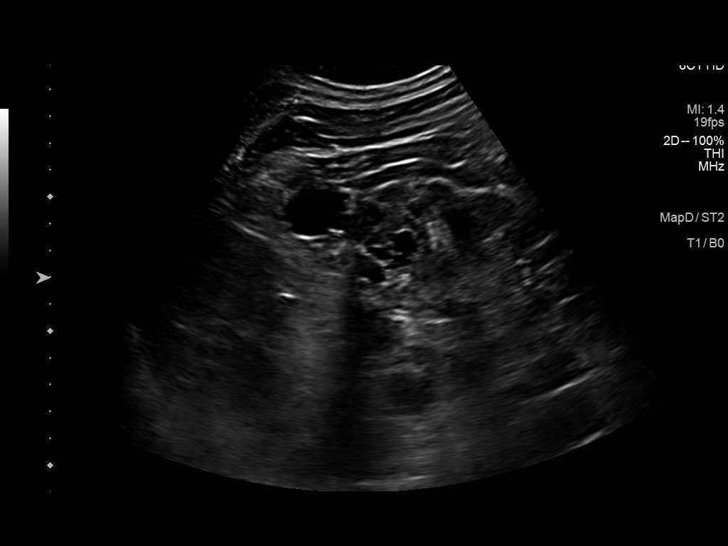
[im 17/50]
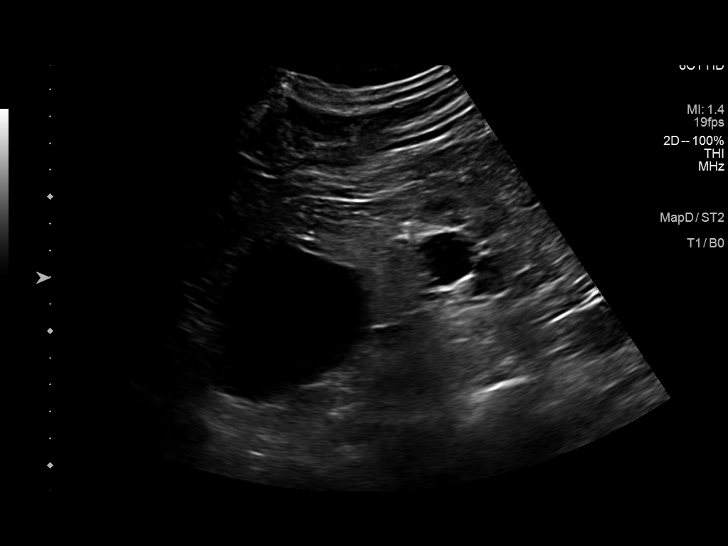
[im 19/50]
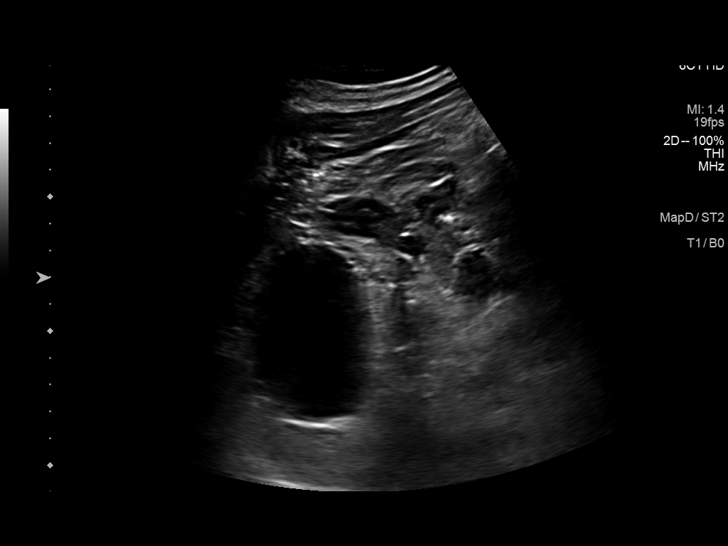
[im 23/50]
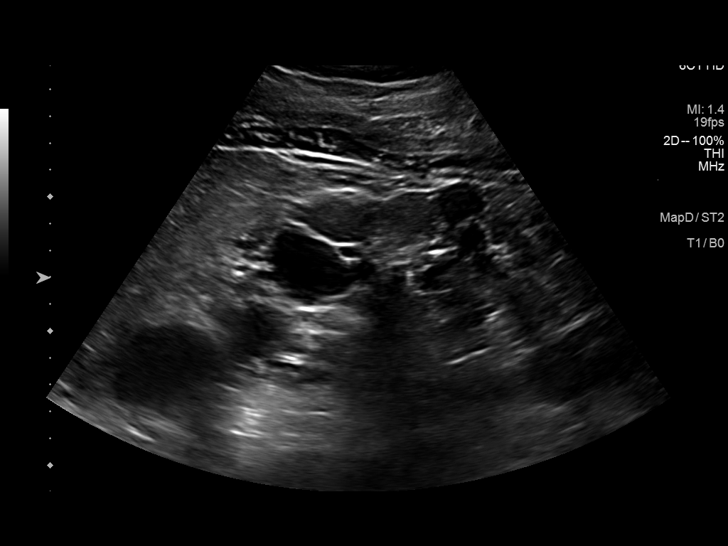
[im 27/50]
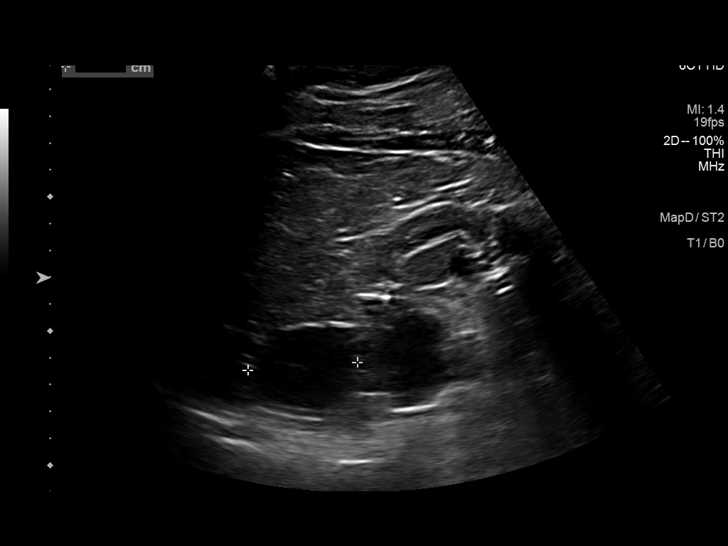
[im 31/50]
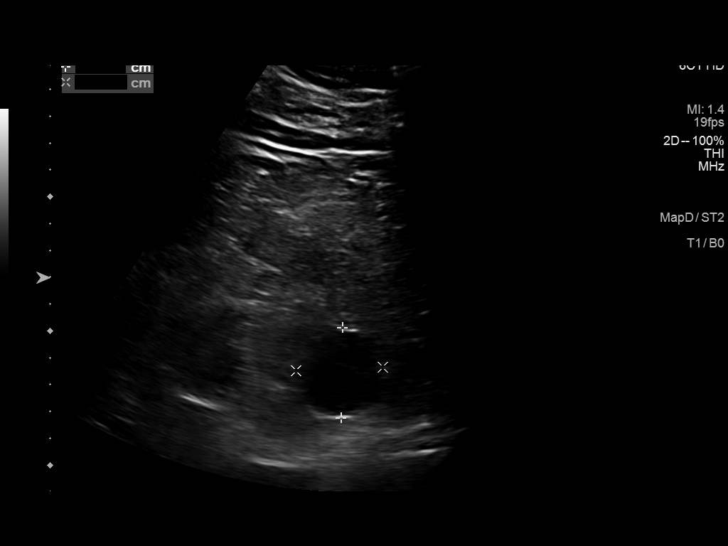
[im 33/50]
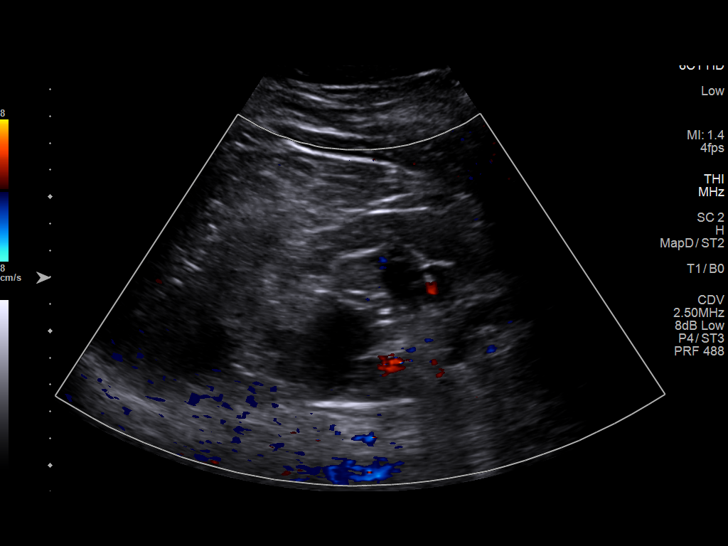
[im 37/50]
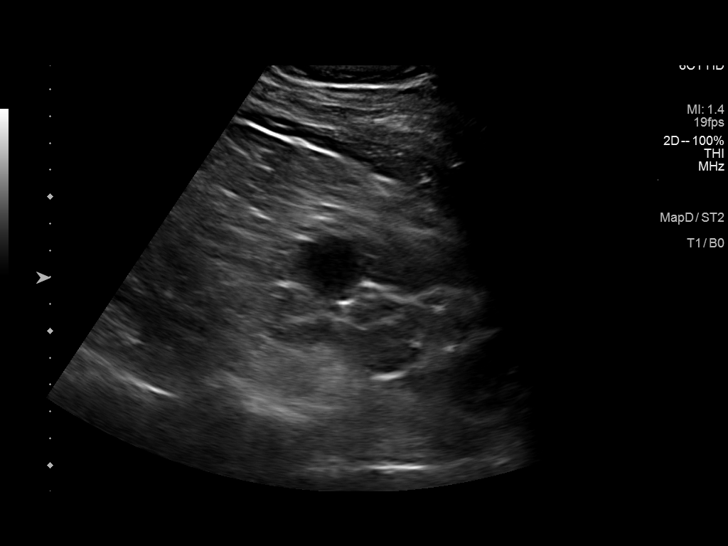
[im 41/50]
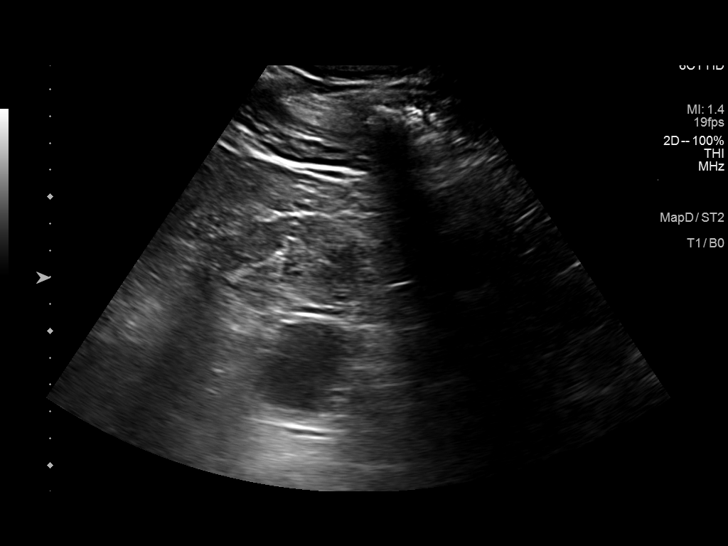
[im 45/50]
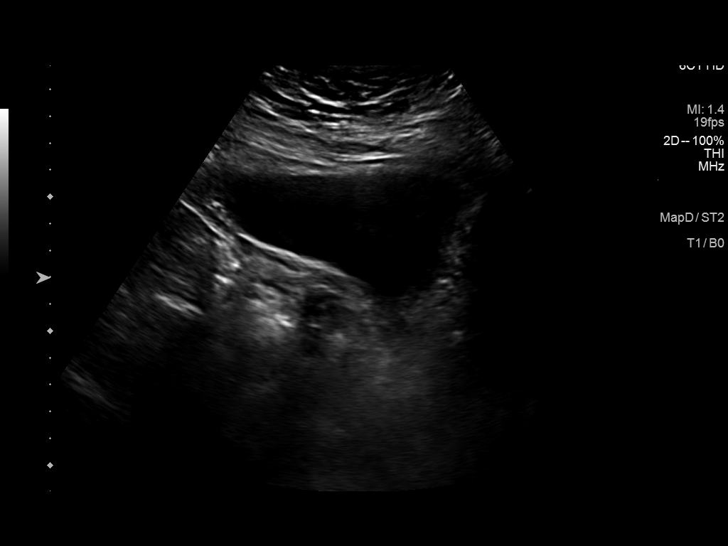
[im 50/50]
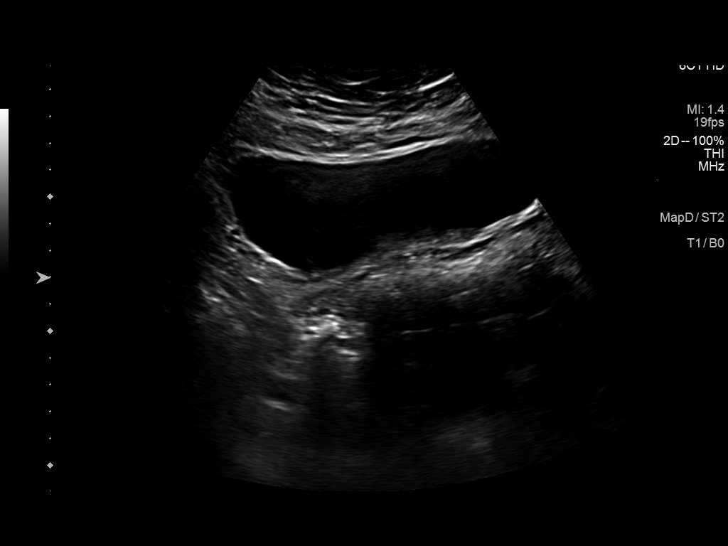

[14 of 25 positions shown; findings below may reference images not displayed]

FINDINGS: Right Kidney:

Renal measurements: 14.4 x 7.0 x 6.2 cm = volume: 325 mL. Greater
than 10 cysts are seen in the right kidney. The largest measures
x 6.6 x 5.9 cm today versus 7.2 x 5.8 x 5.6 cm previously. The other
cysts are much smaller. No stones or hydronephrosis.

Left Kidney:

Renal measurements: 13.8 x 6.6 x 7.1 cm = volume: 342 mL. Greater
than 10 cysts are seen in the left kidney and the largest measuring
up to 4.1 cm. The largest cyst on the previous study measured 3.9 x
3.1 x 3.8 cm. Two no stones or hydronephrosis.

Bladder:

Appears normal for degree of bladder distention.

Other:

None.
IMPRESSION: Greater than 10 cysts in both kidneys. The largest cysts in both
kidneys are a little larger in the interval. No hydronephrosis.

## 2022-06-09 DIAGNOSIS — R809 Proteinuria, unspecified: Secondary | ICD-10-CM | POA: Diagnosis not present

## 2022-06-09 DIAGNOSIS — Z79899 Other long term (current) drug therapy: Secondary | ICD-10-CM | POA: Diagnosis not present

## 2022-06-09 DIAGNOSIS — D631 Anemia in chronic kidney disease: Secondary | ICD-10-CM | POA: Diagnosis not present

## 2022-06-09 DIAGNOSIS — Z7985 Long-term (current) use of injectable non-insulin antidiabetic drugs: Secondary | ICD-10-CM | POA: Diagnosis not present

## 2022-06-09 DIAGNOSIS — K5790 Diverticulosis of intestine, part unspecified, without perforation or abscess without bleeding: Secondary | ICD-10-CM | POA: Diagnosis not present

## 2022-06-09 DIAGNOSIS — N1832 Chronic kidney disease, stage 3b: Secondary | ICD-10-CM | POA: Diagnosis not present

## 2022-06-09 DIAGNOSIS — I129 Hypertensive chronic kidney disease with stage 1 through stage 4 chronic kidney disease, or unspecified chronic kidney disease: Secondary | ICD-10-CM | POA: Diagnosis not present

## 2022-06-09 DIAGNOSIS — Q613 Polycystic kidney, unspecified: Secondary | ICD-10-CM | POA: Diagnosis not present

## 2022-06-09 DIAGNOSIS — E1122 Type 2 diabetes mellitus with diabetic chronic kidney disease: Secondary | ICD-10-CM | POA: Diagnosis not present

## 2022-06-14 DIAGNOSIS — N189 Chronic kidney disease, unspecified: Secondary | ICD-10-CM | POA: Diagnosis not present

## 2022-06-14 DIAGNOSIS — N390 Urinary tract infection, site not specified: Secondary | ICD-10-CM | POA: Diagnosis not present

## 2022-06-14 DIAGNOSIS — N183 Chronic kidney disease, stage 3 unspecified: Secondary | ICD-10-CM | POA: Diagnosis not present

## 2022-06-16 DIAGNOSIS — R062 Wheezing: Secondary | ICD-10-CM | POA: Diagnosis not present

## 2022-06-16 DIAGNOSIS — Q791 Other congenital malformations of diaphragm: Secondary | ICD-10-CM | POA: Diagnosis not present

## 2022-06-16 DIAGNOSIS — R058 Other specified cough: Secondary | ICD-10-CM | POA: Diagnosis not present

## 2022-06-16 DIAGNOSIS — J0110 Acute frontal sinusitis, unspecified: Secondary | ICD-10-CM | POA: Diagnosis not present

## 2022-06-16 DIAGNOSIS — U071 COVID-19: Secondary | ICD-10-CM | POA: Diagnosis not present

## 2022-06-16 DIAGNOSIS — Z1152 Encounter for screening for COVID-19: Secondary | ICD-10-CM | POA: Diagnosis not present

## 2022-06-22 DIAGNOSIS — E1122 Type 2 diabetes mellitus with diabetic chronic kidney disease: Secondary | ICD-10-CM | POA: Diagnosis not present

## 2022-06-22 DIAGNOSIS — Q612 Polycystic kidney, adult type: Secondary | ICD-10-CM | POA: Diagnosis not present

## 2022-06-22 DIAGNOSIS — E785 Hyperlipidemia, unspecified: Secondary | ICD-10-CM | POA: Diagnosis not present

## 2022-06-22 DIAGNOSIS — D638 Anemia in other chronic diseases classified elsewhere: Secondary | ICD-10-CM | POA: Diagnosis not present

## 2022-06-22 DIAGNOSIS — I129 Hypertensive chronic kidney disease with stage 1 through stage 4 chronic kidney disease, or unspecified chronic kidney disease: Secondary | ICD-10-CM | POA: Diagnosis not present

## 2022-06-22 DIAGNOSIS — R310 Gross hematuria: Secondary | ICD-10-CM | POA: Diagnosis not present

## 2022-06-22 DIAGNOSIS — N183 Chronic kidney disease, stage 3 unspecified: Secondary | ICD-10-CM | POA: Diagnosis not present

## 2022-06-22 DIAGNOSIS — N2581 Secondary hyperparathyroidism of renal origin: Secondary | ICD-10-CM | POA: Diagnosis not present

## 2022-06-23 DIAGNOSIS — N281 Cyst of kidney, acquired: Secondary | ICD-10-CM | POA: Diagnosis not present

## 2022-06-23 DIAGNOSIS — N1832 Chronic kidney disease, stage 3b: Secondary | ICD-10-CM | POA: Diagnosis not present

## 2022-06-24 DIAGNOSIS — R5383 Other fatigue: Secondary | ICD-10-CM | POA: Diagnosis not present

## 2022-06-24 DIAGNOSIS — N1832 Chronic kidney disease, stage 3b: Secondary | ICD-10-CM | POA: Diagnosis not present

## 2022-06-24 DIAGNOSIS — U071 COVID-19: Secondary | ICD-10-CM | POA: Diagnosis not present

## 2022-06-28 ENCOUNTER — Other Ambulatory Visit: Payer: Self-pay | Admitting: Nephrology

## 2022-06-28 DIAGNOSIS — Q612 Polycystic kidney, adult type: Secondary | ICD-10-CM

## 2022-06-28 DIAGNOSIS — R310 Gross hematuria: Secondary | ICD-10-CM

## 2022-06-29 DIAGNOSIS — H2513 Age-related nuclear cataract, bilateral: Secondary | ICD-10-CM | POA: Diagnosis not present

## 2022-06-29 DIAGNOSIS — H52203 Unspecified astigmatism, bilateral: Secondary | ICD-10-CM | POA: Diagnosis not present

## 2022-06-29 DIAGNOSIS — E119 Type 2 diabetes mellitus without complications: Secondary | ICD-10-CM | POA: Diagnosis not present

## 2022-06-30 ENCOUNTER — Ambulatory Visit
Admission: RE | Admit: 2022-06-30 | Discharge: 2022-06-30 | Disposition: A | Discharge status: Home / Self Care | Payer: Medicare Other | Source: Ambulatory Visit | Attending: Nephrology | Admitting: Nephrology

## 2022-06-30 DIAGNOSIS — Q612 Polycystic kidney, adult type: Secondary | ICD-10-CM

## 2022-06-30 DIAGNOSIS — R310 Gross hematuria: Secondary | ICD-10-CM

## 2022-06-30 MED ORDER — GADOPICLENOL 0.5 MMOL/ML IV SOLN
10.0000 mL | Freq: Once | INTRAVENOUS | Status: AC | PRN
  Administered 2022-06-30: 10 mL via INTRAVENOUS

## 2022-07-13 DIAGNOSIS — N529 Male erectile dysfunction, unspecified: Secondary | ICD-10-CM | POA: Diagnosis not present

## 2022-07-13 DIAGNOSIS — D649 Anemia, unspecified: Secondary | ICD-10-CM | POA: Diagnosis not present

## 2022-07-13 DIAGNOSIS — I10 Essential (primary) hypertension: Secondary | ICD-10-CM | POA: Diagnosis not present

## 2022-07-13 DIAGNOSIS — Z125 Encounter for screening for malignant neoplasm of prostate: Secondary | ICD-10-CM | POA: Diagnosis not present

## 2022-07-13 DIAGNOSIS — R7989 Other specified abnormal findings of blood chemistry: Secondary | ICD-10-CM | POA: Diagnosis not present

## 2022-07-13 DIAGNOSIS — E1129 Type 2 diabetes mellitus with other diabetic kidney complication: Secondary | ICD-10-CM | POA: Diagnosis not present

## 2022-07-13 DIAGNOSIS — E291 Testicular hypofunction: Secondary | ICD-10-CM | POA: Diagnosis not present

## 2022-07-13 DIAGNOSIS — E785 Hyperlipidemia, unspecified: Secondary | ICD-10-CM | POA: Diagnosis not present

## 2022-07-13 DIAGNOSIS — N1832 Chronic kidney disease, stage 3b: Secondary | ICD-10-CM | POA: Diagnosis not present

## 2022-07-13 DIAGNOSIS — E538 Deficiency of other specified B group vitamins: Secondary | ICD-10-CM | POA: Diagnosis not present

## 2022-07-20 DIAGNOSIS — Z1339 Encounter for screening examination for other mental health and behavioral disorders: Secondary | ICD-10-CM | POA: Diagnosis not present

## 2022-07-20 DIAGNOSIS — N1832 Chronic kidney disease, stage 3b: Secondary | ICD-10-CM | POA: Diagnosis not present

## 2022-07-20 DIAGNOSIS — Z23 Encounter for immunization: Secondary | ICD-10-CM | POA: Diagnosis not present

## 2022-07-20 DIAGNOSIS — E669 Obesity, unspecified: Secondary | ICD-10-CM | POA: Diagnosis not present

## 2022-07-20 DIAGNOSIS — Z1331 Encounter for screening for depression: Secondary | ICD-10-CM | POA: Diagnosis not present

## 2022-07-20 DIAGNOSIS — E785 Hyperlipidemia, unspecified: Secondary | ICD-10-CM | POA: Diagnosis not present

## 2022-07-20 DIAGNOSIS — I700 Atherosclerosis of aorta: Secondary | ICD-10-CM | POA: Diagnosis not present

## 2022-07-20 DIAGNOSIS — Z0000 Encounter for general adult medical examination without abnormal findings: Secondary | ICD-10-CM | POA: Diagnosis not present

## 2022-07-20 DIAGNOSIS — E1129 Type 2 diabetes mellitus with other diabetic kidney complication: Secondary | ICD-10-CM | POA: Diagnosis not present

## 2022-07-20 DIAGNOSIS — G4733 Obstructive sleep apnea (adult) (pediatric): Secondary | ICD-10-CM | POA: Diagnosis not present

## 2022-07-20 DIAGNOSIS — I129 Hypertensive chronic kidney disease with stage 1 through stage 4 chronic kidney disease, or unspecified chronic kidney disease: Secondary | ICD-10-CM | POA: Diagnosis not present

## 2022-07-20 DIAGNOSIS — Q612 Polycystic kidney, adult type: Secondary | ICD-10-CM | POA: Diagnosis not present

## 2022-07-25 ENCOUNTER — Ambulatory Visit: Payer: Medicare Other | Admitting: Cardiology

## 2022-08-01 ENCOUNTER — Ambulatory Visit: Payer: Medicare Other | Admitting: Cardiology

## 2022-08-01 ENCOUNTER — Encounter: Payer: Self-pay | Admitting: Cardiology

## 2022-08-01 VITALS — BP 123/75 | HR 67 | Ht 71.0 in | Wt 209.0 lb

## 2022-08-01 DIAGNOSIS — I10 Essential (primary) hypertension: Secondary | ICD-10-CM | POA: Diagnosis not present

## 2022-08-01 DIAGNOSIS — N1832 Chronic kidney disease, stage 3b: Secondary | ICD-10-CM | POA: Diagnosis not present

## 2022-08-01 DIAGNOSIS — R809 Proteinuria, unspecified: Secondary | ICD-10-CM

## 2022-08-01 DIAGNOSIS — E1122 Type 2 diabetes mellitus with diabetic chronic kidney disease: Secondary | ICD-10-CM

## 2022-08-01 DIAGNOSIS — E782 Mixed hyperlipidemia: Secondary | ICD-10-CM

## 2022-08-01 MED ORDER — KERENDIA 10 MG PO TABS: 1.0000 | ORAL_TABLET | Freq: Every day | ORAL | 2 refills | Status: DC

## 2022-08-01 NOTE — Progress Notes (Signed)
Primary Physician/Referring:  Ginger Organ., MD  Patient ID: Joseph Nguyen, male    DOB: 02/08/1951, 72 y.o.   MRN: [id number]  Chief Complaint  Patient presents with   Hypertension   Follow-up   HPI:    Joseph Nguyen  is a 72 y.o. male  with hypertension, hyperlipidemia, and controlled diabetes. He also has h/o asymptomatic multiple cysts in the kidney,  proteinuria, presents for annual visit.  He remains asymptomatic from cardiac status.  Past Medical History:  Diagnosis Date   Controlled type 2 diabetes mellitus without complication ([place]) [id number]   DM (diabetes mellitus) ([place])    Essential hypertension 07/27/2011   Hiatal hernia    Hypercholesterolemia    Kidney disease    Polycystic,    Mixed hyperlipidemia 11/05/2018   Obesity    Polycystic kidney disease 11/05/2018   Past Surgical History:  Procedure Laterality Date   CARDIOVASCULAR STRESS TEST  10-18-2005   EF 79%   TUMOR REMOVAL  1968   Left Breast Benign   Family History  Problem Relation Age of Onset   Cancer Mother    Stroke Sister 30       3 Total, all within a 1 year and half   Diabetes Brother    Chronic Renal Failure Brother     Social History   Tobacco Use   Smoking status: Former    Packs/day: 0.25    Years: 5.00    Total pack years: 1.25    Types: Cigarettes    Quit date: 02/25/1979    Years since quitting: 43.4   Smokeless tobacco: Never   Tobacco comments:    Quite 1yr+ ago  Substance Use Topics   Alcohol use: Yes    Comment: occasionally   Marital Status: Married  ROS  Review of Systems  Cardiovascular:  Negative for chest pain, dyspnea on exertion and syncope.   Objective  Blood pressure 123/75, pulse 67, height '5\' 11"'$  (1.803 m), weight 209 lb (94.8 kg), SpO2 96 %.     08/01/2022    3:04 PM 10/11/2021    2:17 PM 07/28/2021    1:39 PM  Vitals with BMI  Height '5\' 11"'$     Weight 209 lbs 212 lbs   BMI [id number]   Systolic [id number] Diastolic 75  64  Pulse 67       Physical  Exam Neck:     Vascular: No carotid bruit or JVD.  Cardiovascular:     Rate and Rhythm: Normal rate and regular rhythm.     Pulses: Intact distal pulses.     Heart sounds: Normal heart sounds. No murmur heard.    No gallop.  Pulmonary:     Effort: Pulmonary effort is normal.     Breath sounds: Normal breath sounds.  Abdominal:     General: Bowel sounds are normal.     Palpations: Abdomen is soft.  Musculoskeletal:     Right lower leg: No edema.     Left lower leg: No edema.    Laboratory examination:   External labs:   Labs 06/24/2022:  Hb 13.9/HCT 39.6, platelets 190, normal indicis.  A1c 7.7%.  TSH normal at 1.98.  Serum glucose 163 mg, BUN 16, creatinine 1.6, EGFR 42.6 mL, potassium 4.7, LFTs normal.  Total cholesterol 133, triglycerides 125, HDL 35, LDL 73.  Non-HDL cholesterol 98.   Labs 06/09/2022:  Vitamin D 19.0.  Intact PTH elevated at 112.  Uric acid  normal at 6.8.  Urinary protein 50.  Urine blood 3+.   Labs 11/04/2021:  Hb 13.5/HCT 40.4, platelets 140, normal indicis.  Urine analysis protein 2+.  Serum glucose 187, BUN 22, creatinine 1.5, EGFR 46,, potassium 4.9.  LFTs normal.  A1c 6.4%.  Medications and allergies   Allergies  Allergen Reactions   Amlodipine Other (See Comments)    Edema- ankles   Hydralazine Other (See Comments)    Chest pain and weakness   Jardiance [Empagliflozin] Nausea Only and Other (See Comments)    Was taken off of Metformin because of Polycystic kidney disease (taken successfully for 4-5 years) and has been experiencing severe listlessness, in addition to nausea   Zocor [Simvastatin - High Dose] Other (See Comments)    Myalgias      Current Outpatient Medications:    acetaminophen (TYLENOL) 325 MG tablet, Take 325-650 mg by mouth every 8 (eight) hours as needed for mild pain (or headaches)., Disp: , Rfl:    cholecalciferol (VITAMIN D3) 25 MCG (1000 UT) tablet, Take 1,000 Units by mouth daily., Disp: , Rfl:     Coenzyme Q10 (COQ10) 100 MG CAPS, Take 100 mg by mouth daily., Disp: , Rfl:    Ferrous Gluconate (IRON) 240 (27 Fe) MG TABS, Take by mouth., Disp: , Rfl:    fluticasone (FLONASE) 50 MCG/ACT nasal spray, Place 2 sprays into both nostrils daily as needed for allergies or rhinitis., Disp: , Rfl:    multivitamin-lutein (OCUVITE-LUTEIN) CAPS capsule, Take 1 capsule by mouth daily., Disp: , Rfl:    olmesartan (BENICAR) 40 MG tablet, Take 1 tablet by mouth daily., Disp: , Rfl:    Omega-3 Fatty Acids (FISH OIL) 1000 MG CAPS, Take 1,000 mg by mouth daily., Disp: , Rfl:    omeprazole (PRILOSEC) 40 MG capsule, Take 40 mg by mouth daily before breakfast., Disp: , Rfl:    rosuvastatin (CRESTOR) 10 MG tablet, take 1 tablet by mouth once daily (Patient taking differently: Take 10 mg by mouth at bedtime.), Disp: 15 tablet, Rfl: 0   RSV vaccine recomb adjuvanted (AREXVY) 120 MCG/0.5ML injection, Inject into the muscle., Disp: 0.5 mL, Rfl: 0   Semaglutide,0.25 or 0.'5MG'$ /DOS, 2 MG/3ML SOPN, Inject 1 mg into the skin every Wednesday., Disp: , Rfl:    vitamin B-12 (CYANOCOBALAMIN) 1000 MCG tablet, Take 1,000 mcg by mouth daily., Disp: , Rfl:    vitamin C (ASCORBIC ACID) 500 MG tablet, Take 500 mg by mouth daily., Disp: , Rfl:    vitamin E 100 UNIT capsule, Take 100 Units by mouth daily., Disp: , Rfl:    Finerenone (KERENDIA) 10 MG TABS, Take 1 tablet (10 mg total) by mouth daily., Disp: 90 tablet, Rfl: 2   Radiology:   US Abdominal Aorta 01/06/2014: No aortic aneurysm. Abdominal aorta measures 2.3 x 2.5 cm in diameter.  MRI of the abdomen 07/15/2022: 1. Polycystic kidney disease redemonstrated with innumerable lesions of varying degrees of complexity, as detailed above. No clearly aggressive appearing renal lesions are confidently identified on today's examination, and previously noted bilateral nephrolithiasis is not readily apparent on today's study secondary to associated susceptibility artifact. 2. Status post  cholecystectomy.  Cardiac Studies:   Lexiscan Myoview stress test 11/10/2018 [facility]):  No evidence of ischemia, normal LVEF.  Low risk study.  Echocardiogram 06/18/2019:  Normal LV systolic function with EF 66%. Left ventricle cavity is normal in size. Normal global wall motion. Normal diastolic filling pattern.  Calculated EF 66%.  Left atrial cavity is  mildly dilated at 4.0 cm and 51 mL.  Normal interatrial septum.  Structurally normal tricuspid valve.  Mild tricuspid regurgitation. No evidence of pulmonary hypertension.  Structurally normal pulmonic valve.  Mild pulmonic regurgitation.  EKG    EKG 08/01/2022: Normal sinus rhythm at rate of 60 bpm, normal axis, incomplete right bundle branch block.  Normal EKG.  Compared to 07/28/2021, no significant change.  Assessment     ICD-10-CM   1. Mixed hyperlipidemia  E78.2 EKG 12-Lead    2. Essential hypertension  I10 EKG 12-Lead    3. Asymptomatic proteinuria  R80.9 Finerenone (KERENDIA) 10 MG TABS    4. Type 2 diabetes mellitus with stage 3b chronic kidney disease, without long-term current use of insulin (HCC)  E11.22 Finerenone (KERENDIA) 10 MG TABS   N18.32       Meds ordered this encounter  Medications   Finerenone (KERENDIA) 10 MG TABS    Sig: Take 1 tablet (10 mg total) by mouth daily.    Dispense:  90 tablet    Refill:  2    Medications Discontinued During This Encounter  Medication Reason   Finerenone (KERENDIA) 10 MG TABS Cost of medication   chlorthalidone (HYGROTON) 25 MG tablet Change in therapy   aspirin EC 81 MG tablet Completed Course   carvedilol (COREG) 12.5 MG tablet Ineffective   chlorthalidone (HYGROTON) 25 MG tablet Change in therapy   Finerenone (KERENDIA) 10 MG TABS Cost of medication    Recommendations:   Joseph Nguyen  is a 72 y.o. male  with hypertension, hyperlipidemia, and controlled diabetes. He also has h/o asymptomatic multiple cysts in the kidney,  proteinuria, presents for  annual visit.  He remains asymptomatic from cardiac status.  1. Mixed hyperlipidemia I reviewed his lipids, lipids under excellent control.  Continue Crestor 10 mg daily.  2. Essential hypertension Blood pressure with Benicar is very well-controlled, continue the same.  Renal function has remained stable with stage IIIb chronic kidney disease.  3. Asymptomatic proteinuria Patient also has asymptomatic proteinuria related to diabetes mellitus.  He would benefit from [place].  On his last office visit a year ago and started him on [place] but it was too expensive.  As more data is available now I would like to try to Rx Carrington Clamp, if in fact he is able to afford this, will check BMP in 2 to 3 weeks after he starts the medication.  Patient is aware to contact me regarding this.  Patient had hematuria, recently had MRI of the abdomen that revealed ruptured cyst leading to hematuria.  He was seen by [facility] nephrology.  4. Type 2 diabetes mellitus with stage 3b chronic kidney disease, without long-term current use of insulin ([place]) Patient is now on Ozempic.  Diabetes is slowly improving.  He is also lost some weight.    CC Drs. Arvella Merles, MD   Adrian Prows, MD, [facility] 08/01/2022, [place] PM Office: [phone] Fax: [phone] Pager: [phone]

## 2022-08-04 ENCOUNTER — Other Ambulatory Visit: Payer: Self-pay

## 2022-08-04 DIAGNOSIS — R809 Proteinuria, unspecified: Secondary | ICD-10-CM

## 2022-08-04 DIAGNOSIS — E1122 Type 2 diabetes mellitus with diabetic chronic kidney disease: Secondary | ICD-10-CM

## 2022-08-04 MED ORDER — KERENDIA 10 MG PO TABS: 1.0000 | ORAL_TABLET | Freq: Every day | ORAL | 2 refills | Status: DC

## 2022-08-24 DIAGNOSIS — L814 Other melanin hyperpigmentation: Secondary | ICD-10-CM | POA: Diagnosis not present

## 2022-08-24 DIAGNOSIS — L821 Other seborrheic keratosis: Secondary | ICD-10-CM | POA: Diagnosis not present

## 2022-08-24 DIAGNOSIS — Z85828 Personal history of other malignant neoplasm of skin: Secondary | ICD-10-CM | POA: Diagnosis not present

## 2022-08-24 DIAGNOSIS — L570 Actinic keratosis: Secondary | ICD-10-CM | POA: Diagnosis not present

## 2022-09-21 DIAGNOSIS — K5730 Diverticulosis of large intestine without perforation or abscess without bleeding: Secondary | ICD-10-CM | POA: Diagnosis not present

## 2022-09-21 DIAGNOSIS — K648 Other hemorrhoids: Secondary | ICD-10-CM | POA: Diagnosis not present

## 2022-09-21 DIAGNOSIS — R197 Diarrhea, unspecified: Secondary | ICD-10-CM | POA: Diagnosis not present

## 2022-09-21 DIAGNOSIS — Z86010 Personal history of colonic polyps: Secondary | ICD-10-CM | POA: Diagnosis not present

## 2022-09-21 DIAGNOSIS — D123 Benign neoplasm of transverse colon: Secondary | ICD-10-CM | POA: Diagnosis not present

## 2022-09-23 DIAGNOSIS — D123 Benign neoplasm of transverse colon: Secondary | ICD-10-CM | POA: Diagnosis not present

## 2022-10-13 DIAGNOSIS — M25561 Pain in right knee: Secondary | ICD-10-CM | POA: Diagnosis not present

## 2022-10-20 DIAGNOSIS — N183 Chronic kidney disease, stage 3 unspecified: Secondary | ICD-10-CM | POA: Diagnosis not present

## 2022-10-20 DIAGNOSIS — D638 Anemia in other chronic diseases classified elsewhere: Secondary | ICD-10-CM | POA: Diagnosis not present

## 2022-10-20 DIAGNOSIS — N2581 Secondary hyperparathyroidism of renal origin: Secondary | ICD-10-CM | POA: Diagnosis not present

## 2022-10-20 DIAGNOSIS — E1122 Type 2 diabetes mellitus with diabetic chronic kidney disease: Secondary | ICD-10-CM | POA: Diagnosis not present

## 2022-10-20 DIAGNOSIS — I129 Hypertensive chronic kidney disease with stage 1 through stage 4 chronic kidney disease, or unspecified chronic kidney disease: Secondary | ICD-10-CM | POA: Diagnosis not present

## 2022-10-20 DIAGNOSIS — E785 Hyperlipidemia, unspecified: Secondary | ICD-10-CM | POA: Diagnosis not present

## 2022-10-20 DIAGNOSIS — Q612 Polycystic kidney, adult type: Secondary | ICD-10-CM | POA: Diagnosis not present

## 2022-10-21 DIAGNOSIS — E119 Type 2 diabetes mellitus without complications: Secondary | ICD-10-CM | POA: Diagnosis not present

## 2022-10-24 DIAGNOSIS — I700 Atherosclerosis of aorta: Secondary | ICD-10-CM | POA: Diagnosis not present

## 2022-10-24 DIAGNOSIS — G4733 Obstructive sleep apnea (adult) (pediatric): Secondary | ICD-10-CM | POA: Diagnosis not present

## 2022-10-24 DIAGNOSIS — D696 Thrombocytopenia, unspecified: Secondary | ICD-10-CM | POA: Diagnosis not present

## 2022-10-24 DIAGNOSIS — E785 Hyperlipidemia, unspecified: Secondary | ICD-10-CM | POA: Diagnosis not present

## 2022-10-24 DIAGNOSIS — N529 Male erectile dysfunction, unspecified: Secondary | ICD-10-CM | POA: Diagnosis not present

## 2022-10-24 DIAGNOSIS — E669 Obesity, unspecified: Secondary | ICD-10-CM | POA: Diagnosis not present

## 2022-10-24 DIAGNOSIS — N1832 Chronic kidney disease, stage 3b: Secondary | ICD-10-CM | POA: Diagnosis not present

## 2022-10-24 DIAGNOSIS — I129 Hypertensive chronic kidney disease with stage 1 through stage 4 chronic kidney disease, or unspecified chronic kidney disease: Secondary | ICD-10-CM | POA: Diagnosis not present

## 2022-10-24 DIAGNOSIS — E1129 Type 2 diabetes mellitus with other diabetic kidney complication: Secondary | ICD-10-CM | POA: Diagnosis not present

## 2022-10-24 DIAGNOSIS — R7989 Other specified abnormal findings of blood chemistry: Secondary | ICD-10-CM | POA: Diagnosis not present

## 2022-11-17 DIAGNOSIS — J3489 Other specified disorders of nose and nasal sinuses: Secondary | ICD-10-CM | POA: Diagnosis not present

## 2022-11-17 DIAGNOSIS — Z1152 Encounter for screening for COVID-19: Secondary | ICD-10-CM | POA: Diagnosis not present

## 2022-11-17 DIAGNOSIS — R5383 Other fatigue: Secondary | ICD-10-CM | POA: Diagnosis not present

## 2022-11-17 DIAGNOSIS — J019 Acute sinusitis, unspecified: Secondary | ICD-10-CM | POA: Diagnosis not present

## 2022-11-17 DIAGNOSIS — R519 Headache, unspecified: Secondary | ICD-10-CM | POA: Diagnosis not present

## 2022-11-17 DIAGNOSIS — R059 Cough, unspecified: Secondary | ICD-10-CM | POA: Diagnosis not present

## 2022-11-21 DIAGNOSIS — R799 Abnormal finding of blood chemistry, unspecified: Secondary | ICD-10-CM | POA: Diagnosis not present

## 2022-11-21 DIAGNOSIS — Q613 Polycystic kidney, unspecified: Secondary | ICD-10-CM | POA: Diagnosis not present

## 2022-11-21 DIAGNOSIS — N1832 Chronic kidney disease, stage 3b: Secondary | ICD-10-CM | POA: Diagnosis not present

## 2023-02-17 DIAGNOSIS — Z23 Encounter for immunization: Secondary | ICD-10-CM | POA: Diagnosis not present

## 2023-02-23 DIAGNOSIS — Z85828 Personal history of other malignant neoplasm of skin: Secondary | ICD-10-CM | POA: Diagnosis not present

## 2023-02-23 DIAGNOSIS — E785 Hyperlipidemia, unspecified: Secondary | ICD-10-CM | POA: Diagnosis not present

## 2023-02-23 DIAGNOSIS — D1801 Hemangioma of skin and subcutaneous tissue: Secondary | ICD-10-CM | POA: Diagnosis not present

## 2023-02-23 DIAGNOSIS — I129 Hypertensive chronic kidney disease with stage 1 through stage 4 chronic kidney disease, or unspecified chronic kidney disease: Secondary | ICD-10-CM | POA: Diagnosis not present

## 2023-02-23 DIAGNOSIS — N183 Chronic kidney disease, stage 3 unspecified: Secondary | ICD-10-CM | POA: Diagnosis not present

## 2023-02-23 DIAGNOSIS — D485 Neoplasm of uncertain behavior of skin: Secondary | ICD-10-CM | POA: Diagnosis not present

## 2023-02-23 DIAGNOSIS — L821 Other seborrheic keratosis: Secondary | ICD-10-CM | POA: Diagnosis not present

## 2023-02-23 DIAGNOSIS — L570 Actinic keratosis: Secondary | ICD-10-CM | POA: Diagnosis not present

## 2023-02-23 DIAGNOSIS — B079 Viral wart, unspecified: Secondary | ICD-10-CM | POA: Diagnosis not present

## 2023-02-23 DIAGNOSIS — D638 Anemia in other chronic diseases classified elsewhere: Secondary | ICD-10-CM | POA: Diagnosis not present

## 2023-02-23 DIAGNOSIS — L814 Other melanin hyperpigmentation: Secondary | ICD-10-CM | POA: Diagnosis not present

## 2023-02-23 DIAGNOSIS — Q612 Polycystic kidney, adult type: Secondary | ICD-10-CM | POA: Diagnosis not present

## 2023-02-23 DIAGNOSIS — R310 Gross hematuria: Secondary | ICD-10-CM | POA: Diagnosis not present

## 2023-02-23 DIAGNOSIS — E1122 Type 2 diabetes mellitus with diabetic chronic kidney disease: Secondary | ICD-10-CM | POA: Diagnosis not present

## 2023-02-23 DIAGNOSIS — D225 Melanocytic nevi of trunk: Secondary | ICD-10-CM | POA: Diagnosis not present

## 2023-02-23 DIAGNOSIS — N2581 Secondary hyperparathyroidism of renal origin: Secondary | ICD-10-CM | POA: Diagnosis not present

## 2023-02-24 LAB — LAB REPORT - SCANNED
Creatinine, POC: 87 mg/dL
EGFR: 39

## 2023-03-02 ENCOUNTER — Encounter: Payer: Self-pay | Admitting: Nephrology

## 2023-06-01 DIAGNOSIS — R799 Abnormal finding of blood chemistry, unspecified: Secondary | ICD-10-CM | POA: Diagnosis not present

## 2023-06-01 DIAGNOSIS — Q613 Polycystic kidney, unspecified: Secondary | ICD-10-CM | POA: Diagnosis not present

## 2023-06-01 DIAGNOSIS — N1832 Chronic kidney disease, stage 3b: Secondary | ICD-10-CM | POA: Diagnosis not present

## 2023-06-09 DIAGNOSIS — E785 Hyperlipidemia, unspecified: Secondary | ICD-10-CM | POA: Diagnosis not present

## 2023-06-09 DIAGNOSIS — G4733 Obstructive sleep apnea (adult) (pediatric): Secondary | ICD-10-CM | POA: Diagnosis not present

## 2023-06-09 DIAGNOSIS — I129 Hypertensive chronic kidney disease with stage 1 through stage 4 chronic kidney disease, or unspecified chronic kidney disease: Secondary | ICD-10-CM | POA: Diagnosis not present

## 2023-06-09 DIAGNOSIS — E669 Obesity, unspecified: Secondary | ICD-10-CM | POA: Diagnosis not present

## 2023-06-09 DIAGNOSIS — I700 Atherosclerosis of aorta: Secondary | ICD-10-CM | POA: Diagnosis not present

## 2023-06-09 DIAGNOSIS — E1129 Type 2 diabetes mellitus with other diabetic kidney complication: Secondary | ICD-10-CM | POA: Diagnosis not present

## 2023-06-09 DIAGNOSIS — R7989 Other specified abnormal findings of blood chemistry: Secondary | ICD-10-CM | POA: Diagnosis not present

## 2023-06-09 DIAGNOSIS — N529 Male erectile dysfunction, unspecified: Secondary | ICD-10-CM | POA: Diagnosis not present

## 2023-06-09 DIAGNOSIS — N1832 Chronic kidney disease, stage 3b: Secondary | ICD-10-CM | POA: Diagnosis not present

## 2023-06-09 DIAGNOSIS — Q612 Polycystic kidney, adult type: Secondary | ICD-10-CM | POA: Diagnosis not present

## 2023-06-09 DIAGNOSIS — D696 Thrombocytopenia, unspecified: Secondary | ICD-10-CM | POA: Diagnosis not present

## 2023-06-14 DIAGNOSIS — K219 Gastro-esophageal reflux disease without esophagitis: Secondary | ICD-10-CM | POA: Diagnosis not present

## 2023-06-30 DIAGNOSIS — Q612 Polycystic kidney, adult type: Secondary | ICD-10-CM | POA: Diagnosis not present

## 2023-06-30 DIAGNOSIS — N2581 Secondary hyperparathyroidism of renal origin: Secondary | ICD-10-CM | POA: Diagnosis not present

## 2023-06-30 DIAGNOSIS — D638 Anemia in other chronic diseases classified elsewhere: Secondary | ICD-10-CM | POA: Diagnosis not present

## 2023-06-30 DIAGNOSIS — I129 Hypertensive chronic kidney disease with stage 1 through stage 4 chronic kidney disease, or unspecified chronic kidney disease: Secondary | ICD-10-CM | POA: Diagnosis not present

## 2023-06-30 DIAGNOSIS — E1122 Type 2 diabetes mellitus with diabetic chronic kidney disease: Secondary | ICD-10-CM | POA: Diagnosis not present

## 2023-06-30 DIAGNOSIS — E785 Hyperlipidemia, unspecified: Secondary | ICD-10-CM | POA: Diagnosis not present

## 2023-06-30 DIAGNOSIS — R310 Gross hematuria: Secondary | ICD-10-CM | POA: Diagnosis not present

## 2023-06-30 DIAGNOSIS — N183 Chronic kidney disease, stage 3 unspecified: Secondary | ICD-10-CM | POA: Diagnosis not present

## 2023-07-01 LAB — LAB REPORT - SCANNED
Albumin, Urine POC: 356.6
Creatinine, POC: 60.9 mg/dL
EGFR: 44
Microalb Creat Ratio: 586

## 2023-07-05 DIAGNOSIS — H5213 Myopia, bilateral: Secondary | ICD-10-CM | POA: Diagnosis not present

## 2023-07-05 DIAGNOSIS — H25013 Cortical age-related cataract, bilateral: Secondary | ICD-10-CM | POA: Diagnosis not present

## 2023-07-05 DIAGNOSIS — H43813 Vitreous degeneration, bilateral: Secondary | ICD-10-CM | POA: Diagnosis not present

## 2023-07-05 DIAGNOSIS — E119 Type 2 diabetes mellitus without complications: Secondary | ICD-10-CM | POA: Diagnosis not present

## 2023-07-10 DIAGNOSIS — N183 Chronic kidney disease, stage 3 unspecified: Secondary | ICD-10-CM | POA: Diagnosis not present

## 2023-07-21 DIAGNOSIS — R7989 Other specified abnormal findings of blood chemistry: Secondary | ICD-10-CM | POA: Diagnosis not present

## 2023-07-21 DIAGNOSIS — N1832 Chronic kidney disease, stage 3b: Secondary | ICD-10-CM | POA: Diagnosis not present

## 2023-07-21 DIAGNOSIS — E538 Deficiency of other specified B group vitamins: Secondary | ICD-10-CM | POA: Diagnosis not present

## 2023-07-21 DIAGNOSIS — E785 Hyperlipidemia, unspecified: Secondary | ICD-10-CM | POA: Diagnosis not present

## 2023-07-21 DIAGNOSIS — I129 Hypertensive chronic kidney disease with stage 1 through stage 4 chronic kidney disease, or unspecified chronic kidney disease: Secondary | ICD-10-CM | POA: Diagnosis not present

## 2023-07-21 DIAGNOSIS — Z125 Encounter for screening for malignant neoplasm of prostate: Secondary | ICD-10-CM | POA: Diagnosis not present

## 2023-07-21 DIAGNOSIS — D649 Anemia, unspecified: Secondary | ICD-10-CM | POA: Diagnosis not present

## 2023-07-21 DIAGNOSIS — E1129 Type 2 diabetes mellitus with other diabetic kidney complication: Secondary | ICD-10-CM | POA: Diagnosis not present

## 2023-07-28 DIAGNOSIS — E1129 Type 2 diabetes mellitus with other diabetic kidney complication: Secondary | ICD-10-CM | POA: Diagnosis not present

## 2023-07-28 DIAGNOSIS — N1832 Chronic kidney disease, stage 3b: Secondary | ICD-10-CM | POA: Diagnosis not present

## 2023-07-28 DIAGNOSIS — R82998 Other abnormal findings in urine: Secondary | ICD-10-CM | POA: Diagnosis not present

## 2023-07-28 DIAGNOSIS — I700 Atherosclerosis of aorta: Secondary | ICD-10-CM | POA: Diagnosis not present

## 2023-07-28 DIAGNOSIS — Z0000 Encounter for general adult medical examination without abnormal findings: Secondary | ICD-10-CM | POA: Diagnosis not present

## 2023-07-28 DIAGNOSIS — E669 Obesity, unspecified: Secondary | ICD-10-CM | POA: Diagnosis not present

## 2023-07-28 DIAGNOSIS — I129 Hypertensive chronic kidney disease with stage 1 through stage 4 chronic kidney disease, or unspecified chronic kidney disease: Secondary | ICD-10-CM | POA: Diagnosis not present

## 2023-07-28 DIAGNOSIS — Q612 Polycystic kidney, adult type: Secondary | ICD-10-CM | POA: Diagnosis not present

## 2023-07-28 DIAGNOSIS — I10 Essential (primary) hypertension: Secondary | ICD-10-CM | POA: Diagnosis not present

## 2023-07-28 DIAGNOSIS — E785 Hyperlipidemia, unspecified: Secondary | ICD-10-CM | POA: Diagnosis not present

## 2023-07-28 DIAGNOSIS — G4733 Obstructive sleep apnea (adult) (pediatric): Secondary | ICD-10-CM | POA: Diagnosis not present

## 2023-07-28 DIAGNOSIS — Z1339 Encounter for screening examination for other mental health and behavioral disorders: Secondary | ICD-10-CM | POA: Diagnosis not present

## 2023-07-28 DIAGNOSIS — Z1331 Encounter for screening for depression: Secondary | ICD-10-CM | POA: Diagnosis not present

## 2023-07-28 DIAGNOSIS — D696 Thrombocytopenia, unspecified: Secondary | ICD-10-CM | POA: Diagnosis not present

## 2023-08-02 ENCOUNTER — Ambulatory Visit: Payer: Medicare Other | Attending: Cardiology | Admitting: Cardiology

## 2023-08-02 ENCOUNTER — Encounter: Payer: Self-pay | Admitting: Cardiology

## 2023-08-02 VITALS — BP 122/68 | HR 70 | Resp 16 | Ht 71.0 in | Wt 199.4 lb

## 2023-08-02 DIAGNOSIS — N1831 Chronic kidney disease, stage 3a: Secondary | ICD-10-CM | POA: Diagnosis present

## 2023-08-02 DIAGNOSIS — E1122 Type 2 diabetes mellitus with diabetic chronic kidney disease: Secondary | ICD-10-CM | POA: Insufficient documentation

## 2023-08-02 DIAGNOSIS — I10 Essential (primary) hypertension: Secondary | ICD-10-CM

## 2023-08-02 DIAGNOSIS — E782 Mixed hyperlipidemia: Secondary | ICD-10-CM | POA: Diagnosis not present

## 2023-08-02 NOTE — Patient Instructions (Signed)
 Medication Instructions:  Your physician recommends that you continue on your current medications as directed. Please refer to the Current Medication list given to you today.  *If you need a refill on your cardiac medications before your next appointment, please call your pharmacy*  Follow-Up: At [facility], you and your health needs are our priority.  As part of our continuing mission to provide you with exceptional heart care, we have created designated Provider Care Teams.  These Care Teams include your primary Cardiologist (physician) and Advanced Practice Providers (APPs -  Physician Assistants and Nurse Practitioners) who all work together to provide you with the care you need, when you need it.  Your next appointment:   As needed  The format for your next appointment:   In Person  Provider:   Yates Decamp, MD {  Other Instructions   1st Floor: - Lobby - Registration  - Pharmacy  - Lab - Cafe  2nd Floor: - PV Lab - Diagnostic Testing (echo, CT, nuclear med)  3rd Floor: - Vacant  4th Floor: - TCTS (cardiothoracic surgery) - AFib Clinic - Structural Heart Clinic - Vascular Surgery  - Vascular Ultrasound  5th Floor: - [facility] Cardiology (general and EP) - Clinical Pharmacy for coumadin, hypertension, lipid, weight-loss medications, and med management appointments    Valet parking services will be available as well.

## 2023-08-02 NOTE — Progress Notes (Signed)
 Cardiology Office Note:  .   Date:  08/02/2023  ID:  Joseph Nguyen, DOB Nov 24, 1950, MRN [id number] PCP: Cleatis Polka., MD  [facility] Providers Cardiologist:  Yates Decamp, MD   History of Present Illness: .   Joseph Nguyen is a 73 y.o. male  with hypertension, hyperlipidemia, and controlled diabetes with stage IIIa chronic kidney disease, abdominal attic atherosclerosis and presently on statin therapy. He also has h/o asymptomatic multiple cysts in the kidney,  proteinuria, presents for annual visit.  He remains asymptomatic from cardiac status.   Discussed the use of AI scribe software for clinical note transcription with the patient, who gave verbal consent to proceed.  History of Present Illness   The patient, with a history of diabetes, high cholesterol, and high blood pressure, presents for a routine check-up. He reports regular physical activity, including walking at least a mile three times a week at [employer] and playing golf at least once a week. He has no major health concerns and reports no chest pain or shortness of breath, except when doing strenuous exercise, which he considers normal. He has a long-standing liver cyst, which has been stable for many years. The patient's diabetes, cholesterol, and blood pressure are well controlled with his current medications. He also has mild chronic stage 3A kidney disease, which has been stable for several years.      Labs    External Labs:  PCP [facility] labs 07/13/2023:  Total cholesterol 112, triglycerides 120, HDL 36, LDL 52.  A1c 6.7%.  Hb 13.6, platelets 100 35K.  Serum creatinine 1.710, potassium 4.8, EGFR 45 mL.  Labs 06/24/2022:   Hb 13.9/HCT 39.6, platelets 190, normal indicis.   A1c 7.7%.  TSH normal at 1.98.   Serum glucose 163 mg, BUN 16, creatinine 1.6, EGFR 42.6 mL, potassium 4.7, LFTs normal.   Total cholesterol 133, triglycerides 125, HDL 35, LDL 73.  Non-HDL cholesterol 98.   Review of Systems   Cardiovascular:  Negative for chest pain, dyspnea on exertion and leg swelling.   Physical Exam:   VS:  BP 122/68 (BP Location: Left Arm, Patient Position: Sitting, Cuff Size: Large)   Pulse 70   Resp 16   Ht 5\' 11"  (1.803 m)   Wt 199 lb 6.4 oz (90.4 kg)   SpO2 98%   BMI 27.81 kg/m    Wt Readings from Last 3 Encounters:  08/02/23 199 lb 6.4 oz (90.4 kg)  08/01/22 209 lb (94.8 kg)  10/11/21 212 lb (96.2 kg)    Physical Exam Neck:     Vascular: No carotid bruit or JVD.  Cardiovascular:     Rate and Rhythm: Normal rate and regular rhythm.     Pulses: Intact distal pulses.     Heart sounds: Normal heart sounds. No murmur heard.    No gallop.  Pulmonary:     Effort: Pulmonary effort is normal.     Breath sounds: Normal breath sounds.  Abdominal:     General: Bowel sounds are normal.     Palpations: Abdomen is soft.  Musculoskeletal:     Right lower leg: No edema.     Left lower leg: No edema.    Studies Reviewed: Marland Kitchen     EKG:    EKG Interpretation Date/Time:  Wednesday August 02 2023 10:47:49 EDT Ventricular Rate:  69 PR Interval:  178 QRS Duration:  98 QT Interval:  408 QTC Calculation: 437 R Axis:   60  Text Interpretation: EKG  08/02/2023: Normal sinus rhythm at rate of 69 bpm, normal axis, incomplete left bundle branch block.  Low-voltage complexes.  Compared to 07/26/2020, no change. Confirmed by Delrae Rend [phone]) on 08/02/2023 11:12:11 AM    EKG 08/01/2022: Normal sinus rhythm at rate of 60 bpm, normal axis, incomplete right bundle branch block. Normal EKG.   Medications and allergies    Allergies  Allergen Reactions   Amlodipine Other (See Comments)    Edema- ankles   Hydralazine Other (See Comments)    Chest pain and weakness   Jardiance [Empagliflozin] Nausea Only and Other (See Comments)    Was taken off of Metformin because of Polycystic kidney disease (taken successfully for 4-5 years) and has been experiencing severe listlessness, in addition to  nausea   Zocor [Simvastatin - High Dose] Other (See Comments)    Myalgias      Current Outpatient Medications:    acetaminophen (TYLENOL) 325 MG tablet, Take 325-650 mg by mouth every 8 (eight) hours as needed for mild pain (or headaches)., Disp: , Rfl:    carvedilol (COREG) 12.5 MG tablet, Take 12.5 mg by mouth 2 (two) times daily with a meal., Disp: , Rfl:    cholecalciferol (VITAMIN D3) 25 MCG (1000 UT) tablet, Take 1,000 Units by mouth daily., Disp: , Rfl:    Coenzyme Q10 (COQ10) 100 MG CAPS, Take 100 mg by mouth daily., Disp: , Rfl:    Ferrous Gluconate (IRON) 240 (27 Fe) MG TABS, Take by mouth., Disp: , Rfl:    fluticasone (FLONASE) 50 MCG/ACT nasal spray, Place 2 sprays into both nostrils daily as needed for allergies or rhinitis., Disp: , Rfl:    losartan-hydrochlorothiazide (HYZAAR) 100-25 MG tablet, Take 1 tablet by mouth daily., Disp: , Rfl:    multivitamin-lutein (OCUVITE-LUTEIN) CAPS capsule, Take 1 capsule by mouth daily., Disp: , Rfl:    olmesartan (BENICAR) 40 MG tablet, Take 1 tablet by mouth daily., Disp: , Rfl:    Omega-3 Fatty Acids (FISH OIL) 1000 MG CAPS, Take 1,000 mg by mouth daily., Disp: , Rfl:    omeprazole (PRILOSEC) 40 MG capsule, Take 40 mg by mouth daily before breakfast., Disp: , Rfl:    rosuvastatin (CRESTOR) 10 MG tablet, take 1 tablet by mouth once daily (Patient taking differently: Take 10 mg by mouth at bedtime.), Disp: 15 tablet, Rfl: 0   Semaglutide,0.25 or 0.5MG /DOS, 2 MG/3ML SOPN, Inject 1 mg into the skin every Wednesday., Disp: , Rfl:    vitamin B-12 (CYANOCOBALAMIN) 1000 MCG tablet, Take 1,000 mcg by mouth daily., Disp: , Rfl:    vitamin C (ASCORBIC ACID) 500 MG tablet, Take 500 mg by mouth daily., Disp: , Rfl:    vitamin E 100 UNIT capsule, Take 100 Units by mouth daily., Disp: , Rfl:    ASSESSMENT AND PLAN: .      ICD-10-CM   1. Mixed hyperlipidemia  E78.2     2. Essential hypertension  I10 EKG 12-Lead    3. Type 2 diabetes mellitus with  stage 3a chronic kidney disease, without long-term current use of insulin (HCC)  E11.22    N18.31      Assessment and Plan    Hypertension   Blood pressure is well controlled with a recent reading of 122/68 mmHg, reducing the risk of vascular events. Continue carvedilol 12.5 mg twice daily and omisartan as prescribed.  Hyperlipidemia   Cholesterol levels are well controlled with LDL at 52 and triglycerides at 120, delaying vascular disease progression. Continue Crestor 10  mg daily.  Atherosclerosis   Mild plaque buildup in the abdominal aorta is managed with lifestyle modifications and medication adherence.  Type 2 Diabetes Mellitus   Diabetes is well controlled with an A1c of 6.7%, minimizing the risk of vascular complications.  Chronic Kidney Disease Stage 3A   Chronic kidney disease with eGFR around 55-56 requires ongoing monitoring to manage progression.  Overweight   Weight is slightly above ideal, with a management plan to maintain between 180-185 lbs.  Liver Cyst   Liver cyst has been stable for 40-50 years, is non-cancerous, and requires no intervention.   As his risk factors are well-controlled, he remains asymptomatic, I will see him back on a as needed basis.      Signed,  Yates Decamp, MD, [facility] 08/02/2023, 11:44 AM [facility] [address] [place], [place] [address] Phone: [phone]. Fax:  [phone]

## 2023-08-16 DIAGNOSIS — K1329 Other disturbances of oral epithelium, including tongue: Secondary | ICD-10-CM | POA: Diagnosis not present

## 2023-08-24 DIAGNOSIS — L814 Other melanin hyperpigmentation: Secondary | ICD-10-CM | POA: Diagnosis not present

## 2023-08-24 DIAGNOSIS — D1801 Hemangioma of skin and subcutaneous tissue: Secondary | ICD-10-CM | POA: Diagnosis not present

## 2023-08-24 DIAGNOSIS — L570 Actinic keratosis: Secondary | ICD-10-CM | POA: Diagnosis not present

## 2023-08-24 DIAGNOSIS — C44719 Basal cell carcinoma of skin of left lower limb, including hip: Secondary | ICD-10-CM | POA: Diagnosis not present

## 2023-08-24 DIAGNOSIS — Z85828 Personal history of other malignant neoplasm of skin: Secondary | ICD-10-CM | POA: Diagnosis not present

## 2023-08-24 DIAGNOSIS — L578 Other skin changes due to chronic exposure to nonionizing radiation: Secondary | ICD-10-CM | POA: Diagnosis not present

## 2023-08-24 DIAGNOSIS — D044 Carcinoma in situ of skin of scalp and neck: Secondary | ICD-10-CM | POA: Diagnosis not present

## 2023-09-20 ENCOUNTER — Ambulatory Visit (INDEPENDENT_AMBULATORY_CARE_PROVIDER_SITE_OTHER): Admitting: Podiatry

## 2023-09-20 ENCOUNTER — Ambulatory Visit: Admitting: Podiatry

## 2023-09-20 ENCOUNTER — Encounter: Payer: Self-pay | Admitting: Podiatry

## 2023-09-20 DIAGNOSIS — M79675 Pain in left toe(s): Secondary | ICD-10-CM | POA: Diagnosis not present

## 2023-09-20 DIAGNOSIS — B351 Tinea unguium: Secondary | ICD-10-CM

## 2023-09-20 DIAGNOSIS — M79674 Pain in right toe(s): Secondary | ICD-10-CM

## 2023-09-20 NOTE — Progress Notes (Signed)
This patient presents to the office with chief complaint of long thick nails and diabetic feet.  This patient  says there  is  no pain and discomfort in their feet.  This patient says there are long thick painful nails.  These nails are painful walking and wearing shoes.  Patient has no history of infection or drainage from both feet.  Patient is unable to  self treat his own nails . This patient presents  to the office today for treatment of the  long nails and a foot evaluation due to history of  diabetes.  General Appearance  Alert, conversant and in no acute stress.  Vascular  Dorsalis pedis and posterior tibial  pulses are palpable  bilaterally.  Capillary return is within normal limits  bilaterally. Temperature is within normal limits  bilaterally.  Neurologic  Senn-Weinstein monofilament wire test within normal limits  bilaterally. Muscle power within normal limits bilaterally.  Nails Thick disfigured discolored nails with subungual debris  from hallux to fifth toes bilaterally. No evidence of bacterial infection or drainage bilaterally.  Orthopedic  No limitations of motion of motion feet .  No crepitus or effusions noted.  No bony pathology or digital deformities noted.  Skin  normotropic skin with no porokeratosis noted bilaterally.  No signs of infections or ulcers noted.     Onychomycosis  Diabetes with no foot complications  IE  Debride nails x 10.  A diabetic foot exam was performed and there is no evidence of any vascular or neurologic pathology.   RTC 3 months.   Gardiner Barefoot DPM

## 2023-10-26 DIAGNOSIS — I129 Hypertensive chronic kidney disease with stage 1 through stage 4 chronic kidney disease, or unspecified chronic kidney disease: Secondary | ICD-10-CM | POA: Diagnosis not present

## 2023-10-26 DIAGNOSIS — D638 Anemia in other chronic diseases classified elsewhere: Secondary | ICD-10-CM | POA: Diagnosis not present

## 2023-10-26 DIAGNOSIS — Q612 Polycystic kidney, adult type: Secondary | ICD-10-CM | POA: Diagnosis not present

## 2023-10-26 DIAGNOSIS — E119 Type 2 diabetes mellitus without complications: Secondary | ICD-10-CM | POA: Diagnosis not present

## 2023-10-26 DIAGNOSIS — R310 Gross hematuria: Secondary | ICD-10-CM | POA: Diagnosis not present

## 2023-10-26 DIAGNOSIS — I10 Essential (primary) hypertension: Secondary | ICD-10-CM | POA: Diagnosis not present

## 2023-10-26 DIAGNOSIS — E1122 Type 2 diabetes mellitus with diabetic chronic kidney disease: Secondary | ICD-10-CM | POA: Diagnosis not present

## 2023-10-26 DIAGNOSIS — N183 Chronic kidney disease, stage 3 unspecified: Secondary | ICD-10-CM | POA: Diagnosis not present

## 2023-10-26 DIAGNOSIS — E785 Hyperlipidemia, unspecified: Secondary | ICD-10-CM | POA: Diagnosis not present

## 2023-10-26 DIAGNOSIS — N2581 Secondary hyperparathyroidism of renal origin: Secondary | ICD-10-CM | POA: Diagnosis not present

## 2023-11-01 DIAGNOSIS — M25562 Pain in left knee: Secondary | ICD-10-CM | POA: Diagnosis not present

## 2023-11-20 DIAGNOSIS — M25562 Pain in left knee: Secondary | ICD-10-CM | POA: Diagnosis not present

## 2023-11-23 DIAGNOSIS — M25562 Pain in left knee: Secondary | ICD-10-CM | POA: Diagnosis not present

## 2023-12-20 ENCOUNTER — Encounter: Payer: Self-pay | Admitting: Podiatry

## 2023-12-20 ENCOUNTER — Ambulatory Visit (INDEPENDENT_AMBULATORY_CARE_PROVIDER_SITE_OTHER): Admitting: Podiatry

## 2023-12-20 DIAGNOSIS — M79674 Pain in right toe(s): Secondary | ICD-10-CM | POA: Diagnosis not present

## 2023-12-20 DIAGNOSIS — B351 Tinea unguium: Secondary | ICD-10-CM | POA: Diagnosis not present

## 2023-12-20 DIAGNOSIS — M79675 Pain in left toe(s): Secondary | ICD-10-CM

## 2023-12-20 DIAGNOSIS — E119 Type 2 diabetes mellitus without complications: Secondary | ICD-10-CM

## 2023-12-20 NOTE — Progress Notes (Signed)
 This patient presents to the office with chief complaint of long thick nails and diabetic feet.  This patient  says there is pain and discomfort in his nails.  He is unable to self trear.  He presents to the office for evaluation and treatment.  Patient is diabetic and has kidney disease.  General Appearance  Alert, conversant and in no acute stress.  Vascular  Dorsalis pedis and posterior tibial  pulses are palpable  bilaterally.  Capillary return is within normal limits  bilaterally. Temperature is within normal limits  bilaterally.  Neurologic  Senn-Weinstein monofilament wire test within normal limits  bilaterally. Muscle power within normal limits bilaterally.  Nails Thick disfigured discolored nails with subungual debris  from hallux to fifth toes bilaterally. No evidence of bacterial infection or drainage bilaterally.  Orthopedic  No limitations of motion of motion feet .  No crepitus or effusions noted.  No bony pathology or digital deformities noted.  Skin  normotropic skin with no porokeratosis noted bilaterally.  No signs of infections or ulcers noted.     Onychomycosis  Diabetes with no foot complications  Debride nails x 10.    RTC 3 months.   Cordella Bold DPM

## 2024-01-31 DIAGNOSIS — I129 Hypertensive chronic kidney disease with stage 1 through stage 4 chronic kidney disease, or unspecified chronic kidney disease: Secondary | ICD-10-CM | POA: Diagnosis not present

## 2024-01-31 DIAGNOSIS — E785 Hyperlipidemia, unspecified: Secondary | ICD-10-CM | POA: Diagnosis not present

## 2024-01-31 DIAGNOSIS — I700 Atherosclerosis of aorta: Secondary | ICD-10-CM | POA: Diagnosis not present

## 2024-01-31 DIAGNOSIS — D696 Thrombocytopenia, unspecified: Secondary | ICD-10-CM | POA: Diagnosis not present

## 2024-01-31 DIAGNOSIS — Q612 Polycystic kidney, adult type: Secondary | ICD-10-CM | POA: Diagnosis not present

## 2024-01-31 DIAGNOSIS — R7989 Other specified abnormal findings of blood chemistry: Secondary | ICD-10-CM | POA: Diagnosis not present

## 2024-01-31 DIAGNOSIS — G4733 Obstructive sleep apnea (adult) (pediatric): Secondary | ICD-10-CM | POA: Diagnosis not present

## 2024-01-31 DIAGNOSIS — E1122 Type 2 diabetes mellitus with diabetic chronic kidney disease: Secondary | ICD-10-CM | POA: Diagnosis not present

## 2024-01-31 DIAGNOSIS — E669 Obesity, unspecified: Secondary | ICD-10-CM | POA: Diagnosis not present

## 2024-01-31 DIAGNOSIS — N529 Male erectile dysfunction, unspecified: Secondary | ICD-10-CM | POA: Diagnosis not present

## 2024-01-31 DIAGNOSIS — N1832 Chronic kidney disease, stage 3b: Secondary | ICD-10-CM | POA: Diagnosis not present

## 2024-02-01 DIAGNOSIS — Z23 Encounter for immunization: Secondary | ICD-10-CM | POA: Diagnosis not present

## 2024-02-26 DIAGNOSIS — D225 Melanocytic nevi of trunk: Secondary | ICD-10-CM | POA: Diagnosis not present

## 2024-02-26 DIAGNOSIS — L570 Actinic keratosis: Secondary | ICD-10-CM | POA: Diagnosis not present

## 2024-02-26 DIAGNOSIS — D1801 Hemangioma of skin and subcutaneous tissue: Secondary | ICD-10-CM | POA: Diagnosis not present

## 2024-02-26 DIAGNOSIS — Z85828 Personal history of other malignant neoplasm of skin: Secondary | ICD-10-CM | POA: Diagnosis not present

## 2024-02-26 DIAGNOSIS — D2261 Melanocytic nevi of right upper limb, including shoulder: Secondary | ICD-10-CM | POA: Diagnosis not present

## 2024-02-26 DIAGNOSIS — D2262 Melanocytic nevi of left upper limb, including shoulder: Secondary | ICD-10-CM | POA: Diagnosis not present

## 2024-02-26 DIAGNOSIS — L814 Other melanin hyperpigmentation: Secondary | ICD-10-CM | POA: Diagnosis not present

## 2024-02-26 DIAGNOSIS — L821 Other seborrheic keratosis: Secondary | ICD-10-CM | POA: Diagnosis not present

## 2024-02-26 DIAGNOSIS — D044 Carcinoma in situ of skin of scalp and neck: Secondary | ICD-10-CM | POA: Diagnosis not present

## 2024-02-29 DIAGNOSIS — N1832 Chronic kidney disease, stage 3b: Secondary | ICD-10-CM | POA: Diagnosis not present

## 2024-02-29 DIAGNOSIS — Q613 Polycystic kidney, unspecified: Secondary | ICD-10-CM | POA: Diagnosis not present

## 2024-02-29 DIAGNOSIS — R799 Abnormal finding of blood chemistry, unspecified: Secondary | ICD-10-CM | POA: Diagnosis not present

## 2024-03-02 DIAGNOSIS — Z23 Encounter for immunization: Secondary | ICD-10-CM | POA: Diagnosis not present

## 2024-03-13 DIAGNOSIS — N1831 Chronic kidney disease, stage 3a: Secondary | ICD-10-CM | POA: Diagnosis not present

## 2024-03-13 DIAGNOSIS — I129 Hypertensive chronic kidney disease with stage 1 through stage 4 chronic kidney disease, or unspecified chronic kidney disease: Secondary | ICD-10-CM | POA: Diagnosis not present

## 2024-03-13 DIAGNOSIS — Q612 Polycystic kidney, adult type: Secondary | ICD-10-CM | POA: Diagnosis not present

## 2024-03-13 DIAGNOSIS — N183 Chronic kidney disease, stage 3 unspecified: Secondary | ICD-10-CM | POA: Diagnosis not present

## 2024-03-13 DIAGNOSIS — N2581 Secondary hyperparathyroidism of renal origin: Secondary | ICD-10-CM | POA: Diagnosis not present

## 2024-03-13 DIAGNOSIS — E1122 Type 2 diabetes mellitus with diabetic chronic kidney disease: Secondary | ICD-10-CM | POA: Diagnosis not present

## 2024-03-13 DIAGNOSIS — D638 Anemia in other chronic diseases classified elsewhere: Secondary | ICD-10-CM | POA: Diagnosis not present

## 2024-03-13 DIAGNOSIS — E785 Hyperlipidemia, unspecified: Secondary | ICD-10-CM | POA: Diagnosis not present

## 2024-03-13 DIAGNOSIS — R310 Gross hematuria: Secondary | ICD-10-CM | POA: Diagnosis not present

## 2024-03-21 ENCOUNTER — Ambulatory Visit (INDEPENDENT_AMBULATORY_CARE_PROVIDER_SITE_OTHER): Admitting: Podiatry

## 2024-03-21 ENCOUNTER — Encounter: Payer: Self-pay | Admitting: Podiatry

## 2024-03-21 DIAGNOSIS — E119 Type 2 diabetes mellitus without complications: Secondary | ICD-10-CM

## 2024-03-21 DIAGNOSIS — B351 Tinea unguium: Secondary | ICD-10-CM

## 2024-03-21 DIAGNOSIS — M79675 Pain in left toe(s): Secondary | ICD-10-CM

## 2024-03-21 DIAGNOSIS — M79674 Pain in right toe(s): Secondary | ICD-10-CM

## 2024-03-21 NOTE — Progress Notes (Signed)
 This patient presents to the office with chief complaint of long thick nails and diabetic feet.  This patient  says there is pain and discomfort in his nails.  He is unable to self trear.  He presents to the office for evaluation and treatment.  Patient is diabetic and has kidney disease.  General Appearance  Alert, conversant and in no acute stress.  Vascular  Dorsalis pedis and posterior tibial  pulses are palpable  bilaterally.  Capillary return is within normal limits  bilaterally. Temperature is within normal limits  bilaterally.  Neurologic  Senn-Weinstein monofilament wire test within normal limits  bilaterally. Muscle power within normal limits bilaterally.  Nails Thick disfigured discolored nails with subungual debris  from hallux to fifth toes bilaterally. No evidence of bacterial infection or drainage bilaterally.  Orthopedic  No limitations of motion of motion feet .  No crepitus or effusions noted.  No bony pathology or digital deformities noted.  Skin  normotropic skin with no porokeratosis noted bilaterally.  No signs of infections or ulcers noted.     Onychomycosis  Diabetes with no foot complications  Debride nails x 10.    RTC 3 months.   Cordella Bold DPM

## 2024-05-19 ENCOUNTER — Other Ambulatory Visit: Payer: Self-pay

## 2024-05-19 ENCOUNTER — Encounter (HOSPITAL_BASED_OUTPATIENT_CLINIC_OR_DEPARTMENT_OTHER): Payer: Self-pay | Admitting: Emergency Medicine

## 2024-05-19 ENCOUNTER — Emergency Department (HOSPITAL_BASED_OUTPATIENT_CLINIC_OR_DEPARTMENT_OTHER)
Admission: EM | Admit: 2024-05-19 | Discharge: 2024-05-19 | Disposition: A | Discharge status: Home / Self Care | Attending: Emergency Medicine | Admitting: Emergency Medicine

## 2024-05-19 ENCOUNTER — Emergency Department (HOSPITAL_BASED_OUTPATIENT_CLINIC_OR_DEPARTMENT_OTHER): Admitting: Radiology

## 2024-05-19 DIAGNOSIS — R0789 Other chest pain: Secondary | ICD-10-CM | POA: Diagnosis not present

## 2024-05-19 DIAGNOSIS — I129 Hypertensive chronic kidney disease with stage 1 through stage 4 chronic kidney disease, or unspecified chronic kidney disease: Secondary | ICD-10-CM | POA: Insufficient documentation

## 2024-05-19 DIAGNOSIS — Z79899 Other long term (current) drug therapy: Secondary | ICD-10-CM | POA: Insufficient documentation

## 2024-05-19 DIAGNOSIS — E1122 Type 2 diabetes mellitus with diabetic chronic kidney disease: Secondary | ICD-10-CM | POA: Diagnosis not present

## 2024-05-19 DIAGNOSIS — N189 Chronic kidney disease, unspecified: Secondary | ICD-10-CM | POA: Insufficient documentation

## 2024-05-19 DIAGNOSIS — R079 Chest pain, unspecified: Secondary | ICD-10-CM | POA: Diagnosis present

## 2024-05-19 LAB — BASIC METABOLIC PANEL WITH GFR
Anion gap: 7 (ref 5–15)
BUN: 21 mg/dL (ref 8–23)
CO2: 26 mmol/L (ref 22–32)
Calcium: 9.8 mg/dL (ref 8.9–10.3)
Chloride: 104 mmol/L (ref 98–111)
Creatinine, Ser: 1.45 mg/dL — ABNORMAL HIGH (ref 0.61–1.24)
GFR, Estimated: 50 mL/min — ABNORMAL LOW
Glucose, Bld: 138 mg/dL — ABNORMAL HIGH (ref 70–99)
Potassium: 4.7 mmol/L (ref 3.5–5.1)
Sodium: 137 mmol/L (ref 135–145)

## 2024-05-19 LAB — CBC
HCT: 42.6 % (ref 39.0–52.0)
Hemoglobin: 14.4 g/dL (ref 13.0–17.0)
MCH: 30.8 pg (ref 26.0–34.0)
MCHC: 33.8 g/dL (ref 30.0–36.0)
MCV: 91 fL (ref 80.0–100.0)
Platelets: 139 K/uL — ABNORMAL LOW (ref 150–400)
RBC: 4.68 MIL/uL (ref 4.22–5.81)
RDW: 13.2 % (ref 11.5–15.5)
WBC: 5.7 K/uL (ref 4.0–10.5)
nRBC: 0 % (ref 0.0–0.2)

## 2024-05-19 LAB — TROPONIN T, HIGH SENSITIVITY
Troponin T High Sensitivity: <15 ng/L (ref 0–19)
Troponin T High Sensitivity: <15 ng/L (ref 0–19)

## 2024-05-19 NOTE — Discharge Instructions (Addendum)
 Your workup today is reassuring. It is very unlikely that your pain is due to an issue in your heart.  Your cardiac enzyme (troponin) was normal today. Your EKG which is a measure of the heart's electrical activity and rhythm is normal today. These would both show abnormalities if you were having a heart attack.  Your chest x-ray is normal today.  Your kidney function is at your baseline.  Your blood counts and electrolytes are normal.  You may take up to 650mg  of tylenol every 6 hours as needed for pain.  Do not take more then 4g per day.  Your blood pressure was elevated here today at 160/89. Aim to get 30 minutes of exercise daily and limit intake of processed/fast foods.  Continue to take your blood pressure medications as prescribed.  Please take and record your blood pressures at home. Take them at the same time every day. Follow up with your PCP cardiologist within the next month for further monitoring of your blood pressures   Return to the ER if you have any shortness of breath, difficulty breathing, chest pain that is worsening with exertion, any pain or swelling in your legs, any other new or concerning symptoms

## 2024-05-19 NOTE — ED Provider Notes (Signed)
 " [place] EMERGENCY DEPARTMENT AT [facility] Provider Note   CSN: [id number] Arrival date & time: 05/19/24  1259     Patient presents with: Chest Pain   Joseph Nguyen is a 73 y.o. male with history of polycystic kidney disease with CKD, type 2 diabetes, hyperlipidemia, hypertension, presents with concern for left-sided chest pain that began when removing Christmas decorations this morning.  Reports that the pain is intermittent and only when he moves his left arm or his neck.  He denies any pain with exertion or pain that radiates to the back.  No pleuritic chest pain.  Denies any feelings of presyncope, any shortness of breath.   Denies any recent long plane or car rides, no recent hospitalizations or surgeries.  No history of a blood clot. Denies any leg pain or swelling.     Chest Pain      Prior to Admission medications  Medication Sig Start Date End Date Taking? Authorizing Provider  acetaminophen (TYLENOL) 325 MG tablet Take 325-650 mg by mouth every 8 (eight) hours as needed for mild pain (or headaches).    [provider]  carvedilol (COREG) 12.5 MG tablet Take 12.5 mg by mouth 2 (two) times daily with a meal.    [provider]  cholecalciferol (VITAMIN D3) 25 MCG (1000 UT) tablet Take 1,000 Units by mouth daily.    [provider]  Coenzyme Q10 (COQ10) 100 MG CAPS Take 100 mg by mouth daily.    [provider]  Ferrous Gluconate (IRON) 240 (27 Fe) MG TABS Take by mouth.    [provider]  fluticasone (FLONASE) 50 MCG/ACT nasal spray Place 2 sprays into both nostrils daily as needed for allergies or rhinitis. 06/26/19   [provider]  losartan-hydrochlorothiazide (HYZAAR) 100-25 MG tablet Take 1 tablet by mouth daily.    [provider]  multivitamin-lutein (OCUVITE-LUTEIN) CAPS capsule Take 1 capsule by mouth daily.    [provider]  olmesartan (BENICAR) 40 MG tablet Take 1 tablet by mouth  daily. 07/22/21   [provider]  Omega-3 Fatty Acids (FISH OIL) 1000 MG CAPS Take 1,000 mg by mouth daily.    [provider]  omeprazole (PRILOSEC) 40 MG capsule Take 40 mg by mouth daily before breakfast.    [provider]  rosuvastatin  (CRESTOR ) 10 MG tablet take 1 tablet by mouth once daily Patient taking differently: Take 10 mg by mouth at bedtime. 05/21/14   [name], Aleene PARAS, MD  Semaglutide,0.25 or 0.5MG /DOS, 2 MG/3ML SOPN Inject 1 mg into the skin every Wednesday.    [provider]  vitamin B-12 (CYANOCOBALAMIN ) 1000 MCG tablet Take 1,000 mcg by mouth daily.    [provider]  vitamin C (ASCORBIC ACID) 500 MG tablet Take 500 mg by mouth daily.    [provider]  vitamin E 100 UNIT capsule Take 100 Units by mouth daily.    [provider]    Allergies: Amlodipine , Hydralazine , Jardiance [empagliflozin], and Zocor [simvastatin - high dose]    Review of Systems  Cardiovascular:  Positive for chest pain.    Updated Vital Signs BP (!) 160/89 (BP Location: Right Arm)   Pulse (!) 56   Temp (!) 97.5 F (36.4 C) (Oral)   Resp 18   SpO2 98%   Physical Exam Vitals and nursing note reviewed.  Constitutional:      General: He is not in acute distress.    Appearance: He is well-developed.  HENT:     Head: Normocephalic and atraumatic.  Eyes:     Conjunctiva/sclera: Conjunctivae normal.  Cardiovascular:     Rate and Rhythm: Normal rate and regular rhythm.     Heart sounds: No murmur heard.    Comments: 2+ radial and pedal pulses bilaterally Pulmonary:     Effort: Pulmonary effort is normal. No respiratory distress.     Breath sounds: Normal breath sounds.  Abdominal:     Palpations: Abdomen is soft.     Tenderness: There is no abdominal tenderness.  Musculoskeletal:        General: No swelling.     Cervical back: Neck supple.     Comments: Patient reports pain is reproduced when he moves his neck down or moves  his left arm.  No pain at rest.  No tenderness to anterior chest wall  No lower extremity edema noted.  No calf tenderness to palpation bilaterally  Skin:    General: Skin is warm and dry.     Capillary Refill: Capillary refill takes less than 2 seconds.  Neurological:     Mental Status: He is alert.  Psychiatric:        Mood and Affect: Mood normal.     (all labs ordered are listed, but only abnormal results are displayed) Labs Reviewed  BASIC METABOLIC PANEL WITH GFR - Abnormal; Notable for the following components:      Result Value   Glucose, Bld 138 (*)    Creatinine, Ser 1.45 (*)    GFR, Estimated 50 (*)    All other components within normal limits  CBC - Abnormal; Notable for the following components:   Platelets 139 (*)    All other components within normal limits  TROPONIN T, HIGH SENSITIVITY  TROPONIN T, HIGH SENSITIVITY    EKG: EKG Interpretation Date/Time:  Sunday May 19 2024 13:09:48 EST Ventricular Rate:  56 PR Interval:  169 QRS Duration:  109 QT Interval:  428 QTC Calculation: 413 R Axis:   87  Text Interpretation: Sinus rhythm Borderline right axis deviation Abnormal R-wave progression, early transition No change Confirmed by Bari Flank [phone]) on 05/19/2024 4:18:02 PM  Radiology: DG Chest 2 View Result Date: 05/19/2024 EXAM: 2 VIEW(S) XRAY OF THE CHEST 05/19/2024 01:21:45 PM COMPARISON: Chest x-ray 07/26/2020. CLINICAL HISTORY: chest pain FINDINGS: LUNGS AND PLEURA: Right diaphragm eventration. No focal pulmonary opacity. No pleural effusion. No pneumothorax. HEART AND MEDIASTINUM: Atherosclerotic plaque. No acute abnormality of the cardiac and mediastinal silhouettes. BONES AND SOFT TISSUES: No acute osseous abnormality. IMPRESSION: 1. No acute findings. Electronically signed by: Morgane Naveau MD 05/19/2024 02:24 PM EST RP Workstation: [id number]     Procedures   Medications Ordered in the ED - No data to display                                   Medical Decision Making Amount and/or Complexity of Data Reviewed Labs: ordered. Radiology: ordered.     Differential diagnosis includes but is not limited to ACS, arrhythmia, aortic aneurysm, pericarditis, myocarditis, pericardial effusion, cardiac tamponade, musculoskeletal pain, GERD, Boerhaave's syndrome, DVT/PE, pneumonia, pleural effusion   ED Course:  Upon initial evaluation, patient is very well-appearing, no acute distress.  Normal vitals aside from his elevated blood pressure upon arrival at 160/89.  He has a regular rate and rhythm on cardiac auscultation.  No murmurs.  No current chest pain.  However, pain can  be reproduced with movement of the left arm and left neck.  No lower extremity edema or calf tenderness to palpation on exam.   Labs Ordered: I Ordered, and personally interpreted labs.  The pertinent results include:   Initial and repeat troponin within normal limits BMP with baseline creatinine at 1.45 with GFR 50.  Normal electrolytes CBC within normal limits  Imaging Studies ordered: I ordered imaging studies including chest x-ray I independently visualized the imaging with scope of interpretation limited to determining acute life threatening conditions related to emergency care. Imaging showed no acute abnormality I agree with the radiologist interpretation   Cardiac Monitoring: / EKG: The patient was maintained on a cardiac monitor.  I personally viewed and interpreted the cardiac monitored which showed an underlying rhythm of: Normal sinus rhythm   Medications Given: None  Upon re-evaluation, patient remains without chest pain and has stable vitals.   Low concern for ACS at this time given troponin remains stable with initial and repeat troponin within normal limits, pain non-exertional, and EKG with normal sinus rhythm and no ST changes. Chest x-ray without any acute abnormality. Low concern for DVT or PE at this time given no significant risk  factors for PE (no recent long plane or car rides, no recent hospitalizations or surgeries, no history of cancer or DVT/PE) and he does not have any hypoxia, tachycardia, and pain nonpleuritic. Suspect pain is musculoskeletal given the reproducible nature with movement of the neck and left arm as well as that occurring when he was actively putting away Christmas decorations.  Patient is stable and appropriate for discharge home at this time  Impression: Chest pain, likely musculoskeletal  Disposition:  The patient was discharged home with instructions that he may take Tylenol as needed for pain.  Will avoid NSAIDs given his decreased kidney function. Return precautions given and patient verbalized understanding.    Record Review: External records from outside source obtained and reviewed including visit with Dr. Ladona on 3/12/25with cardiology, follows with for management of his hyperlipidemia, hypertension, and diabetes.     This chart was dictated using voice recognition software, Dragon. Despite the best efforts of this provider to proofread and correct errors, errors may still occur which can change documentation meaning.       Final diagnoses:  Atypical chest pain    ED Discharge Orders     None          Veta Palma, PA-C 05/19/24 1701    Bari Roxie HERO, DO 05/19/24 1736  "

## 2024-05-19 NOTE — ED Triage Notes (Signed)
 Chest pain into left arm Started around noon while taking down [employer]

## 2024-06-21 ENCOUNTER — Encounter: Payer: Self-pay | Admitting: Podiatry

## 2024-06-21 ENCOUNTER — Ambulatory Visit (INDEPENDENT_AMBULATORY_CARE_PROVIDER_SITE_OTHER): Admitting: Podiatry

## 2024-06-21 DIAGNOSIS — B351 Tinea unguium: Secondary | ICD-10-CM

## 2024-06-21 DIAGNOSIS — E119 Type 2 diabetes mellitus without complications: Secondary | ICD-10-CM | POA: Diagnosis not present

## 2024-06-21 DIAGNOSIS — M79675 Pain in left toe(s): Secondary | ICD-10-CM

## 2024-06-21 DIAGNOSIS — M79674 Pain in right toe(s): Secondary | ICD-10-CM | POA: Diagnosis not present

## 2024-06-21 NOTE — Progress Notes (Signed)
 This patient presents to the office with chief complaint of long thick nails and diabetic feet.  This patient  says there is pain and discomfort in his nails.  He is unable to self trear.  He presents to the office for evaluation and treatment.  Patient is diabetic and has kidney disease.  General Appearance  Alert, conversant and in no acute stress.  Vascular  Dorsalis pedis and posterior tibial  pulses are palpable  bilaterally.  Capillary return is within normal limits  bilaterally. Temperature is within normal limits  bilaterally.  Neurologic  Senn-Weinstein monofilament wire test within normal limits  bilaterally. Muscle power within normal limits bilaterally.  Nails Thick disfigured discolored nails with subungual debris  from hallux to fifth toes bilaterally. No evidence of bacterial infection or drainage bilaterally.  Orthopedic  No limitations of motion of motion feet .  No crepitus or effusions noted.  No bony pathology or digital deformities noted.  Skin  normotropic skin with no porokeratosis noted bilaterally.  No signs of infections or ulcers noted.     Onychomycosis  Diabetes with no foot complications  Debride nails x 10.    RTC 3 months.   Cordella Bold DPM

## 2024-09-19 ENCOUNTER — Ambulatory Visit: Admitting: Podiatry
